# Patient Record
Sex: Female | Born: 1970 | Race: White | Hispanic: No | Marital: Married | Smoking: Current every day smoker
Health system: Southern US, Community
[De-identification: ages and names within clinical notes are randomized; demographics above are authoritative.]

## PROBLEM LIST (undated history)

## (undated) DIAGNOSIS — M549 Dorsalgia, unspecified: Secondary | ICD-10-CM

## (undated) DIAGNOSIS — I10 Essential (primary) hypertension: Secondary | ICD-10-CM

## (undated) DIAGNOSIS — R519 Headache, unspecified: Secondary | ICD-10-CM

## (undated) HISTORY — DX: Headache, unspecified: R51.9

## (undated) HISTORY — DX: Dorsalgia, unspecified: M54.9

## (undated) HISTORY — DX: Essential (primary) hypertension: I10

## (undated) HISTORY — PX: TONSILLECTOMY: SUR1361

## (undated) HISTORY — PX: ABDOMINAL HYSTERECTOMY: SHX81

## (undated) HISTORY — PX: CARPAL TUNNEL RELEASE: SHX101

## (undated) HISTORY — PX: CHOLECYSTECTOMY: SHX55

---

## 1997-04-23 HISTORY — PX: REDUCTION MAMMAPLASTY: SUR839

## 2004-08-12 ENCOUNTER — Encounter: Admission: RE | Admit: 2004-08-12 | Discharge: 2004-08-12 | Payer: Self-pay | Admitting: Family Medicine

## 2007-09-02 ENCOUNTER — Ambulatory Visit: Payer: Self-pay | Admitting: Family Medicine

## 2007-09-02 DIAGNOSIS — E785 Hyperlipidemia, unspecified: Secondary | ICD-10-CM

## 2007-09-02 DIAGNOSIS — F329 Major depressive disorder, single episode, unspecified: Secondary | ICD-10-CM

## 2007-09-02 DIAGNOSIS — E039 Hypothyroidism, unspecified: Secondary | ICD-10-CM

## 2007-09-02 DIAGNOSIS — F17200 Nicotine dependence, unspecified, uncomplicated: Secondary | ICD-10-CM | POA: Insufficient documentation

## 2007-09-02 LAB — CONVERTED CEMR LAB: TSH: 1.55 microintl units/mL (ref 0.350–5.50)

## 2007-09-03 ENCOUNTER — Encounter: Payer: Self-pay | Admitting: Family Medicine

## 2007-09-03 ENCOUNTER — Telehealth (INDEPENDENT_AMBULATORY_CARE_PROVIDER_SITE_OTHER): Payer: Self-pay | Admitting: *Deleted

## 2007-09-04 LAB — CONVERTED CEMR LAB: T3, Free: 3.4 pg/mL (ref 2.3–4.2)

## 2007-09-30 ENCOUNTER — Telehealth: Payer: Self-pay | Admitting: Family Medicine

## 2007-12-16 ENCOUNTER — Ambulatory Visit: Payer: Self-pay | Admitting: Family Medicine

## 2007-12-16 ENCOUNTER — Telehealth: Payer: Self-pay | Admitting: Family Medicine

## 2007-12-16 DIAGNOSIS — H6690 Otitis media, unspecified, unspecified ear: Secondary | ICD-10-CM | POA: Insufficient documentation

## 2008-01-08 ENCOUNTER — Ambulatory Visit: Payer: Self-pay | Admitting: Family Medicine

## 2008-01-08 DIAGNOSIS — J209 Acute bronchitis, unspecified: Secondary | ICD-10-CM

## 2008-05-08 ENCOUNTER — Ambulatory Visit: Payer: Self-pay | Admitting: Occupational Medicine

## 2008-05-11 ENCOUNTER — Telehealth (INDEPENDENT_AMBULATORY_CARE_PROVIDER_SITE_OTHER): Payer: Self-pay | Admitting: *Deleted

## 2009-05-30 ENCOUNTER — Ambulatory Visit: Payer: Self-pay | Admitting: Occupational Medicine

## 2009-05-30 DIAGNOSIS — M549 Dorsalgia, unspecified: Secondary | ICD-10-CM | POA: Insufficient documentation

## 2009-05-30 LAB — CONVERTED CEMR LAB
Eosinophils Absolute: 0 10*3/uL (ref 0.0–0.7)
Eosinophils Relative: 0 % (ref 0–5)
HCT: 42.1 % (ref 36.0–46.0)
Lymphocytes Relative: 30 % (ref 12–46)
Platelets: 202 10*3/uL (ref 150–400)
RDW: 12.3 % (ref 11.5–15.5)

## 2010-05-23 NOTE — Assessment & Plan Note (Signed)
Summary: COUGH/KH Room 1   Vital Signs:  Patient Profile:   41 Years Old Female CC:      Cold & URI symptoms, cough x 5 days fever 100 at night Height:     66.3 inches Weight:      176 pounds O2 Sat:      98 % Temp:     98.1 degrees F oral Pulse rate:   77 / minute Pulse rhythm:   regular Resp:     16 per minute BP sitting:   110 / 71  (left arm)  Vitals Entered By: Emilio Math (January 08, 2008 11:45 AM)                  Updated Prior Medication List: ZOLOFT 100 MG  TABS (SERTRALINE HCL) Take 1 tablet by mouth once a day CHANTIX CONTINUING MONTH PAK 1 MG  TABS (VARENICLINE TARTRATE) Take as directed MIRTAZAPINE 15 MG TABS (MIRTAZAPINE) Take 1 tablet by mouth once a day at bedtime  Current Allergies: ! AVELOX History of Present Illness Chief Complaint: Cold & URI symptoms, cough x 5 days fever 100 at night History of Present Illness: ONSET SUNDAY WITH SORE THROAT THAT RESOLVED. DEVELOPED RUNNY NOSE AND SINUS CONGESTION. COUGH IS CONSTANT OCC PRODUCTIVE. FEVER HAS BEEN LOW GRADE AT MOST. NO VOMITING OR DIARRHEA.   REVIEW OF SYSTEMS  Constitutional Symptoms       Complains of fever, chills, headaches, and body aches.   Ear/Nose/Throat/Mouth       Complains of nasal discharge, sinus problems, and sore throat.      Denies ear pain and ear discharge.  Respiratory       Complains of wheezing and frequent cough.      Denies shortness of breath.  Cardiovascular       Denies chest pain.  Gastrointestinal       Denies nausea/vomiting and diarrhea.       Physical Exam General appearance: well developed, well nourished, no acute distress Head: normocephalic, atraumatic Ears: normal, no lesions or deformities Nasal: NASAL CONGESTION WITH SINUS TENDERNESS Oral/Pharynx: tongue normal, posterior pharynx without erythema or exudate Neck: neck supple,  trachea midline, no masses Chest/Lungs: scattered wheezes throughout all lobes, CONSTANT COUGY Heart: regular rate and   rhythm, no murmur Skin: no obvious rashes or lesions     Patient Instructions: 1)  AVOID MILK AND CAFFINE PRODUCTS. MOTRIN AND TYLENOL AS NEEDED.   Assessment New Problems: BRONCHITIS, ACUTE (ICD-466.0)   Plan New Medications/Changes: PROVENTIL HFA 108 (90 BASE) MCG/ACT AERS (ALBUTEROL SULFATE) 2 PUFFS QID AS NEEDED  #1 x 0, 01/08/2008, Skyelyn Scruggs DO CHERATUSSIN AC 100-10 MG/5ML SYRP (GUAIFENESIN-CODEINE) 1-2 TSP Q 6 HRS as needed COUGH  #4 x 0, 01/08/2008, Vann Okerlund DO ZITHROMAX Z-PAK 250 MG TABS (AZITHROMYCIN) AS DIRECTED  #1 x 0, 01/08/2008, Marvis Moeller DO  New Orders: Est. Patient Level III [16109] Follow Up: Follow up in 2-3 days if no improvement     Prescriptions: PROVENTIL HFA 108 (90 BASE) MCG/ACT AERS (ALBUTEROL SULFATE) 2 PUFFS QID AS NEEDED  #1 x 0   Entered and Authorized by:   Marvis Moeller DO   Signed by:   Marvis Moeller DO on 01/08/2008   Method used:   Print then Give to Patient   RxID:   [id number] CHERATUSSIN AC 100-10 MG/5ML SYRP (GUAIFENESIN-CODEINE) 1-2 TSP Q 6 HRS as needed COUGH  #4 x 0   Entered and Authorized by:   Marvis Moeller DO  Signed by:   Marvis Moeller DO on 01/08/2008   Method used:   Print then Give to Patient   RxID:   [id number] ZITHROMAX Z-PAK 250 MG TABS (AZITHROMYCIN) AS DIRECTED  #1 x 0   Entered and Authorized by:   Marvis Moeller DO   Signed by:   Marvis Moeller DO on 01/08/2008   Method used:   Print then Give to Patient   RxID:   [id number].[email or web address]  ]  Past Medical History:    Reviewed history from 09/02/2007 and no changes required:       Current Problems:        HYPERLIPIDEMIA (ICD-272.4)       DEPRESSION, RECURRENT (ICD-311)       HYPOTHYROIDISM (ICD-244.9)  Past Surgical History:    Reviewed history from 09/02/2007 and no changes required:       Hysterectomy, endometriosis 2002       Carpel tunnel 2003, right & Left       Breast reduction 2001       Cholecystectomy2003        tonsillectomy 1990

## 2010-05-23 NOTE — Assessment & Plan Note (Signed)
Summary: UPPER BACK/TJ   Vital Signs:  Patient Profile:   40 Years Old Female CC:      Upper back pain x 1 & 1/2  weeks Height:     67.5 inches Weight:      180 pounds O2 Sat:      100 % O2 treatment:    Room Air Temp:     97.8 degrees F oral Pulse rate:   76 / minute Pulse rhythm:   regular Resp:     12 per minute BP sitting:   121 / 85  (right arm) Cuff size:   Upper back painregular  Pt. in pain?   yes    Location:   upper back    Intensity:   8    Type:       sharp  Vitals Entered By: Emilio Math (May 30, 2009 10:34 AM)                   Current Allergies (reviewed today): ! AVELOXHistory of Present Illness Chief Complaint: Upper back pain x 1 & 1/2  weeks History of Present Illness: Presents with complaints of low grade fever to 100,  body aches, sore throat, headache,  for the last 7-10 days.    She has not improved much at all.   No abdominal pain, nausea or vomiting.    Also complains of generalized malaise.  No reports of cough or shortness of breath.   She had a left lower lobe pneumonia 1 year ago, but has not been ill in the past year.     She has a second complaint of right scapular pain for the last 10 days or so.   Says the thinks she remembers falling around the time of onset.  No neck pain or low back pain.  No arm pain.   Ibuprofen does not help.   Current Meds * ADVIL 400 mg 4 x day * TYLENOL 2 tabs every 4 hours LAMICTAL 100 MG TABS (LAMOTRIGINE)   REVIEW OF SYSTEMS Constitutional Symptoms      Denies fever, chills, night sweats, weight loss, weight gain, and fatigue.  Eyes       Denies change in vision, eye pain, eye discharge, glasses, contact lenses, and eye surgery. Ear/Nose/Throat/Mouth       Denies hearing loss/aids, change in hearing, ear pain, ear discharge, dizziness, frequent runny nose, frequent nose bleeds, sinus problems, sore throat, hoarseness, and tooth pain or bleeding.  Respiratory       Denies dry cough, productive cough,  wheezing, shortness of breath, asthma, bronchitis, and emphysema/COPD.  Cardiovascular       Denies murmurs, chest pain, and tires easily with exhertion.    Gastrointestinal       Denies stomach pain, nausea/vomiting, diarrhea, constipation, blood in bowel movements, and indigestion. Genitourniary       Denies painful urination, kidney stones, and loss of urinary control. Neurological       Denies paralysis, seizures, and fainting/blackouts. Musculoskeletal       Complains of muscle pain, joint pain, and decreased range of motion.      Denies joint stiffness, redness, swelling, muscle weakness, and gout.  Skin       Denies bruising, unusual mles/lumps or sores, and hair/skin or nail changes.  Psych       Denies mood changes, temper/anger issues, anxiety/stress, speech problems, depression, and sleep problems.  Past History:  Past Medical History: Reviewed history from 01/08/2008 and no changes  required. Current Problems:  HYPERLIPIDEMIA (ICD-272.4) DEPRESSION, RECURRENT (ICD-311) HYPOTHYROIDISM (ICD-244.9)  Past Surgical History: Reviewed history from 05/08/2008 and no changes required. Hysterectomy, endometriosis 2002 Carpel tunnel 2003, right & Left Breast reduction 2001 Cholecystectomy2003 tonsillectomy 1990 elbow surgery 2009  Family History: Reviewed history from 09/02/2007 and no changes required. Grandparent with DM Parent, brother with Hi chol Brother with HTN  Social History: Reviewed history from 05/08/2008 and no changes required. 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  Married to [place] with 2 duaghter and one son.   Current Smoker - 1 pack per day for 15 years Alcohol use-no Drug use-no Regular exercise-yes Physical Exam General appearance: well developed, well nourished, no acute distress Head: normocephalic, atraumatic Eyes: conjunctivae and lids normal Pupils: equal, round, reactive to light Ears: normal, no lesions or deformities Nasal: mucosa  pink, nonedematous, no septal deviation, turbinates normal Oral/Pharynx: tongue normal, posterior pharynx without erythema or exudate Neck: supple,anterior lymphadenopathy present Chest/Lungs: no rales, wheezes, or rhonchi bilateral, breath sounds equal without effort Heart: regular rate and  rhythm, no murmur Tender in right scapular region.  Assessment New Problems: BACK PAIN, UPPER (ICD-724.5) UPPER RESPIRATORY INFECTION, ACUTE (ICD-465.9)   Plan New Orders: New Patient Level III [99203] T-CBC w/Diff [[id number]] Planning Comments:   I think she has a viral infection.  I will check a CBC today.   She will follow up with Dr. Cathey Endow or Linford Arnold if not better in 2 weeks.   I do not think she has a serious back injury either.  Recommend OTC ibuprofen and follow up  upstairs for PT if not better in 2 weeks.   The patient and/or caregiver has been counseled thoroughly with regard to medications prescribed including dosage, schedule, interactions, rationale for use, and possible side effects and they verbalize understanding.  Diagnoses and expected course of recovery discussed and will return if not improved as expected or if the condition worsens. Patient and/or caregiver verbalized understanding.

## 2010-05-25 ENCOUNTER — Ambulatory Visit (INDEPENDENT_AMBULATORY_CARE_PROVIDER_SITE_OTHER): Payer: PRIVATE HEALTH INSURANCE | Admitting: Emergency Medicine

## 2010-05-25 ENCOUNTER — Encounter: Payer: Self-pay | Admitting: Emergency Medicine

## 2010-05-25 DIAGNOSIS — H6980 Other specified disorders of Eustachian tube, unspecified ear: Secondary | ICD-10-CM | POA: Insufficient documentation

## 2010-05-25 DIAGNOSIS — J069 Acute upper respiratory infection, unspecified: Secondary | ICD-10-CM | POA: Insufficient documentation

## 2010-05-25 LAB — CONVERTED CEMR LAB: Rapid Strep: NEGATIVE

## 2010-05-26 ENCOUNTER — Encounter: Payer: Self-pay | Admitting: Emergency Medicine

## 2010-05-28 ENCOUNTER — Telehealth (INDEPENDENT_AMBULATORY_CARE_PROVIDER_SITE_OTHER): Payer: Self-pay | Admitting: *Deleted

## 2010-05-31 NOTE — Assessment & Plan Note (Signed)
Summary: SORE THROAT/EAR HURTS/NH room 4   Vital Signs:  Patient profile:   40 year old female Height:      68 inches Weight:      178 pounds BMI:     27.16 O2 Sat:      100 % on Room air Temp:     99.0 degrees F oral Pulse rate:   78 / minute Resp:     16 per minute BP sitting:   125 / 79  (left arm) Cuff size:   regular  Vitals Entered By: Clemens Catholic LPN (May 25, 2010 10:39 AM)  O2 Flow:  Room air CC: sore throat, ear ache , HA Is Patient Diabetic? No Comments pt c/o sore throat, HA , fatigue x 6days. she has taken OTC Advil. her daughter tested positive for strep last wk.    Chief Complaint:  sore throat, ear ache , and HA.  History of Present Illness: 40 Years Old Female complains of onset of cold symptoms for 5-6 days.  Rami has been using nothing OTC.  Her daughter had strep throat last week but is better now. + sore throat No cough No pleuritic pain No wheezing + nasal congestion + post-nasal drainage + sinus pain/pressure No chest congestion No itchy/red eyes + earache No hemoptysis No SOB No chills/sweats + fever No nausea No vomiting No abdominal pain No diarrhea No skin rashes No fatigue No myalgias No headache   Allergies (verified): 1)  ! Avelox  Past History:  Past Medical History: Reviewed history from 01/08/2008 and no changes required. Current Problems:  HYPERLIPIDEMIA (ICD-272.4) DEPRESSION, RECURRENT (ICD-311) HYPOTHYROIDISM (ICD-244.9)  Past Surgical History: Reviewed history from 05/08/2008 and no changes required. Hysterectomy, endometriosis 2002 Carpel tunnel 2003, right & Left Breast reduction 2001 Cholecystectomy2003 tonsillectomy 1990 elbow surgery 2009  Family History: Reviewed history from 09/02/2007 and no changes required. Grandparent with DM Parent, brother with Hi chol Brother with HTN  Social History: Reviewed history from 05/08/2008 and no changes required. 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  Married to [place] with 2 duaghter and one son.   Current Smoker - 1 pack per day for 15 years Alcohol use-no Drug use-no Regular exercise-yes  Physical Exam  General:  Well-developed,well-nourished,in no acute distress; alert,appropriate and cooperative throughout examination Ears:  Clear fluid and mild pressure behind both TM's, mild erythema L, canals normal Nose:  clear discharge Mouth:  clear PND, no erythema, no exudates, OP patent Neck:  no ant cerv LAD Lungs:  Normal respiratory effort, chest expands symmetrically. Lungs are clear to auscultation, no crackles or wheezes. Heart:  Normal rate and regular rhythm. S1 and S2 normal without gallop, murmur, click, rub or other extra sounds. Psych:  Cognition and judgment appear intact. Alert and cooperative with normal attention span and concentration. No apparent delusions, illusions, hallucinations  History of Present Illness Chief Complaint: sore throat, ear ache , HA    Impression & Recommendations:  Problem # 1:  URI (ICD-465.9) 1)  Take the prescribed antibiotic as instructed.  Hold for a few days since this is currently likely viral.  Rapid strep negative. Throat culture is pending. 2)  Use nasal saline solution (over the counter) at least 3 times a day. 3)  Use over the counter decongestants like Zyrtec-D every 12 hours as needed to help with congestion. 4)  Can take tylenol every 6 hours or motrin every 8 hours for pain or fever. 5)  Follow up with  your primary doctor  if no improvement in 5-7 days, sooner if increasing pain, fever, or new symptoms.    Orders: Rapid Strep (91478) T-Culture, Throat ([id number])  Complete Medication List: 1)  Zoloft 50 Mg Tabs (Sertraline hcl) 2)  Amoxicillin 875 Mg Tabs (Amoxicillin) .Marland Kitchen.. 1 by mouth two times a day for 7 days Prescriptions: AMOXICILLIN 875 MG TABS (AMOXICILLIN) 1 by mouth two times a day for 7 days  #14 x 0   Entered and Authorized by:   Hoyt Koch MD   Signed by:   Hoyt Koch MD on 05/25/2010   Method used:   Print then Give to Patient   RxID:   [id number]    Orders Added: 1)  Est. Patient Level IV [44010] 2)  Rapid Strep [27253] 3)  T-Culture, Throat [[id number]]    Laboratory Results  Date/Time Received: May 25, 2010 10:51 AM  Date/Time Reported: May 25, 2010 10:51 AM   Other Tests  Rapid Strep: negative  Kit Test Internal QC: Negative   (Normal Range: Negative)

## 2010-06-08 NOTE — Progress Notes (Signed)
  Phone Note Outgoing Call Call back at [facility] Phone [phone] [facility]     Call placed by: Emilio Math,  May 28, 2010 11:53 AM Call placed to: Patient Summary of Call: Home number has been disconected.

## 2010-09-27 ENCOUNTER — Inpatient Hospital Stay (INDEPENDENT_AMBULATORY_CARE_PROVIDER_SITE_OTHER)
Admission: RE | Admit: 2010-09-27 | Discharge: 2010-09-27 | Disposition: A | Discharge status: Home / Self Care | Payer: PRIVATE HEALTH INSURANCE | Source: Ambulatory Visit | Attending: Emergency Medicine | Admitting: Emergency Medicine

## 2010-09-27 ENCOUNTER — Encounter: Payer: Self-pay | Admitting: Emergency Medicine

## 2010-09-27 DIAGNOSIS — M79609 Pain in unspecified limb: Secondary | ICD-10-CM | POA: Insufficient documentation

## 2011-03-26 NOTE — Progress Notes (Signed)
Summary: PAIN IN RIGHT HAND   Vital Signs:  Patient Profile:   40 Years Old Female CC:      right hand pain x 1 month Height:     68 inches Weight:      184 pounds O2 Sat:      100 % O2 treatment:    Room Air Temp:     98.3 degrees F oral Pulse rate:   88 / minute Resp:     16 per minute BP sitting:   134 / 87  (left arm) Cuff size:   regular  Pt. in pain?   yes    Location:   right hand    Intensity:   5    Type:       ache  Vitals Entered By: Lajean Saver RN (September 27, 2010 3:38 PM)                   Updated Prior Medication List: No Medications Current Allergies (reviewed today): ! AVELOXHistory of Present Illness History from: patient Chief Complaint: right hand pain x 1 month History of Present Illness: R hand pain for a month. Localized in her R thumb and index finger.  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  The pain is intermittant but daily and she feels that those fingers lock up.  No weakness or numbness noticed.  Sometimes is worse at night.  Not using any meds or modalities for the pain. No elbow/write/shoulder pain.  She is R handed.  REVIEW OF SYSTEMS Constitutional Symptoms      Denies fever, chills, night sweats, weight loss, weight gain, and fatigue.  Eyes       Denies change in vision, eye pain, eye discharge, glasses, contact lenses, and eye surgery. Ear/Nose/Throat/Mouth       Denies hearing loss/aids, change in hearing, ear pain, ear discharge, dizziness, frequent runny nose, frequent nose bleeds, sinus problems, sore throat, hoarseness, and tooth pain or bleeding.  Respiratory       Denies dry cough, productive cough, wheezing, shortness of breath, asthma, bronchitis, and emphysema/COPD.  Cardiovascular       Denies murmurs, chest pain, and tires easily with exhertion.    Gastrointestinal       Denies stomach pain, nausea/vomiting, diarrhea, constipation, blood in bowel  movements, and indigestion. Genitourniary       Denies painful urination, kidney stones, and loss of urinary control. Neurological       Denies paralysis, seizures, and fainting/blackouts. Musculoskeletal       Complains of muscle pain, joint pain, joint stiffness, and swelling.      Denies decreased range of motion, redness, muscle weakness, and gout.      Comments: right hand Skin       Denies bruising, unusual mles/lumps or sores, and hair/skin or nail changes.  Psych       Denies mood changes, temper/anger issues, anxiety/stress, speech problems, depression, and sleep problems. Other Comments: patient c/o right hand pain x 1 month. thumb and index finger "locking up". Pain is increasing and more constant. Used Advil for pain   Past History:  Past Medical History: Reviewed history from 01/08/2008 and no changes required. Current Problems:  HYPERLIPIDEMIA (ICD-272.4) DEPRESSION, RECURRENT (ICD-311) HYPOTHYROIDISM (ICD-244.9)  Past Surgical History: Reviewed history from 05/08/2008 and no changes required. Hysterectomy, endometriosis 2002 Carpel tunnel 2003, right & Left Breast reduction 2001 Cholecystectomy2003 tonsillectomy 1990 elbow surgery  2009  Family History: Reviewed history from 09/02/2007 and no changes required. Grandparent with DM Parent, brother with Hi chol Brother with HTN  Social History: Reviewed history from 05/08/2008 and no changes required. 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  Married to [place] with 2 duaghter and one son.   Current Smoker - 1 pack per day for 15 years Alcohol use-no Drug use-no Regular exercise-yes Physical Exam General appearance: well developed, well nourished, no acute distress MSE: oriented to time, place, and person Thumb and index finger on R hand: FROM active and passive with flexion & extension of DIP, PIP, and MCP.  No nodules felt.  No collateral ligement laxity.  Normal sensation and normal cap refill.  No  deformity or atrophy, no swellilng, no bruising. Assessment New Problems: HAND PAIN (ICD-729.5)   Plan New Orders: Est. Patient Level III [63875] Thumb Spica [L3070] Planning Comments:   Due to her history of median nerve problems, she may have residual scarring from her surgery causing her pain and dysfunction.  Encourage OTC antiinflammatories and ice the next 2 weeks.  Will place her in a thumb spica splint at night.  If not improving in 2 weeks, would refer to Dr. Melvyn Novas for an evaluation +/- OT eval.  However instead she may return to her same neurologist that cared for her in the past since he has her records and may be able to do follow up EMG studies.  No Xrays done today because no trauma.   The patient and/or caregiver has been counseled thoroughly with regard to medications prescribed including dosage, schedule, interactions, rationale for use, and possible side effects and they verbalize understanding.  Diagnoses and expected course of recovery discussed and will return if not improved as expected or if the condition worsens. Patient and/or caregiver verbalized understanding.   Orders Added: 1)  Est. Patient Level III [64332] 2)  Thumb Spica [L3070]

## 2013-01-27 ENCOUNTER — Encounter: Payer: Self-pay | Admitting: *Deleted

## 2013-01-27 ENCOUNTER — Emergency Department (INDEPENDENT_AMBULATORY_CARE_PROVIDER_SITE_OTHER)
Admission: EM | Admit: 2013-01-27 | Discharge: 2013-01-27 | Disposition: A | Discharge status: Home / Self Care | Payer: Managed Care, Other (non HMO) | Source: Home / Self Care | Attending: Family Medicine | Admitting: Family Medicine

## 2013-01-27 ENCOUNTER — Emergency Department (INDEPENDENT_AMBULATORY_CARE_PROVIDER_SITE_OTHER): Payer: Managed Care, Other (non HMO)

## 2013-01-27 DIAGNOSIS — J209 Acute bronchitis, unspecified: Secondary | ICD-10-CM

## 2013-01-27 DIAGNOSIS — R509 Fever, unspecified: Secondary | ICD-10-CM

## 2013-01-27 DIAGNOSIS — R05 Cough: Secondary | ICD-10-CM

## 2013-01-27 LAB — POCT CBC W AUTO DIFF (K'VILLE URGENT CARE)

## 2013-01-27 MED ORDER — AMOXICILLIN ER 775 MG PO TB24: 775.0000 mg | ORAL_TABLET | Freq: Every day | ORAL | Status: DC

## 2013-01-27 MED ORDER — PREDNISONE 20 MG PO TABS: 20.0000 mg | ORAL_TABLET | Freq: Two times a day (BID) | ORAL | Status: DC

## 2013-01-27 MED ORDER — ALBUTEROL SULFATE HFA 108 (90 BASE) MCG/ACT IN AERS: 2.0000 | INHALATION_SPRAY | RESPIRATORY_TRACT | Status: DC | PRN

## 2013-01-27 MED ORDER — BENZONATATE 100 MG PO CAPS: ORAL_CAPSULE | ORAL | Status: DC

## 2013-01-27 NOTE — ED Provider Notes (Signed)
CSN: [id number]     Arrival date & time 01/27/13  0903 History   First MD Initiated Contact with Patient 01/27/13 0940     Chief Complaint  Patient presents with  . Cough  . Otalgia     HPI Comments: Patient developed a sore throat one week ago that was diagnosed as strep pharyngitis, and she is presently taking a 10 day course of amoxicillin.  Her sore throat is now much better. Six days ago she developed sinus congestion and a non-productive cough that has persisted and is worse at night.  She has had sweats for the past two days. She has had pneumonia in the past. She continues to smoke.  Patient is a 42 y.o. female presenting with ear pain. The history is provided by the patient.  Otalgia   History reviewed. No pertinent past medical history. Past Surgical History  Procedure Laterality Date  . Cholecystectomy    . Abdominal hysterectomy    . Tonsillectomy    . Carpal tunnel release     History reviewed. No pertinent family history. History  Substance Use Topics  . Smoking status: Current Every Day Smoker    Types: Cigarettes  . Smokeless tobacco: Never Used     Comment: vapor cigs  . Alcohol Use: No   OB History   Grav Para Term Preterm Abortions TAB SAB Ect Mult Living                 Review of Systems  HENT: Positive for ear pain.    + sore throat + cough No pleuritic pain No wheezing + nasal congestion + post-nasal drainage No sinus pain/pressure No itchy/red eyes ? earache No hemoptysis + SOB + low grade fever, + chills No nausea No vomiting No abdominal pain No diarrhea No urinary symptoms No skin rashes + fatigue + myalgias + headache Used OTC meds without relief  Allergies  Moxifloxacin  Home Medications   Current Outpatient Rx  Name  Route  Sig  Dispense  Refill  . amoxicillin (AMOXIL) 500 MG tablet   Oral   Take 500 mg by mouth 2 (two) times daily.         . DULoxetine (CYMBALTA) 60 MG capsule   Oral   Take 60 mg by mouth  daily.         Marland Kitchen albuterol (PROVENTIL HFA;VENTOLIN HFA) 108 (90 BASE) MCG/ACT inhaler   Inhalation   Inhale 2 puffs into the lungs every 4 (four) hours as needed for wheezing.   1 Inhaler   0   . amoxicillin (MOXATAG) 775 MG 24 hr tablet   Oral   Take 1 tablet (775 mg total) by mouth daily.   4 tablet   0   . benzonatate (TESSALON) 100 MG capsule      Take one cap at bedtime as necessary for cough   12 capsule   0   . predniSONE (DELTASONE) 20 MG tablet   Oral   Take 1 tablet (20 mg total) by mouth 2 (two) times daily.   10 tablet   0    BP 136/87  Pulse 90  Temp(Src) 99.1 F (37.3 C) (Oral)  Resp 18  Wt 209 lb (94.802 kg)  BMI 31.79 kg/m2  SpO2 100% Physical Exam Nursing notes and Vital Signs reviewed. Appearance:  Patient appears stated age, and in no acute distress.  Patient is obese (BMI 31.8) Eyes:  Pupils are equal, round, and reactive to light and accomodation.  Extraocular movement is intact.  Conjunctivae are not inflamed  Ears:  Canals normal.  Tympanic membranes normal.  Nose:  Mildly congested turbinates.  No sinus tenderness.  Pharynx:  Normal Neck:  Supple.  Slightly tender shotty anterior/posterior nodes are palpated bilaterally  Lungs:  Clear to auscultation.  Breath sounds are equal.  Heart:  Regular rate and rhythm without murmurs, rubs, or gallops.  Abdomen:  Nontender without masses or hepatosplenomegaly.  Bowel sounds are present.  No CVA or flank tenderness.  Extremities:  No edema.  No calf tenderness Skin:  No rash present.   ED Course  Procedures (including critical care time) Labs Review Labs Reviewed  POCT CBC W AUTO DIFF ([facility])  WBC 9.0; LY 27.7; MO 4.9; GR 67.4; Hgb 13.5; Platelets 258    Imaging Review Dg Chest 2 View  01/27/2013   CLINICAL DATA:  Cough for 1 week. Persistent fever.  EXAM: CHEST  2 VIEW  COMPARISON:  05/08/2008  FINDINGS: The heart size and mediastinal contours are within normal limits. Both lungs  are clear. The visualized skeletal structures are unremarkable.  IMPRESSION: No active cardiopulmonary disease.   Electronically Signed   By: Amie Portland M.D.   On: 01/27/2013 10:26    MDM   1. Acute bronchitis.  Suspect strep pharyngitis (resolving) with superimposed viral URI    Will continue amoxicillin for 4 more days.  Prednisone burst.  Prescription written for Benzonatate (Tessalon) to take at bedtime for night-time cough.  Add albuterol inhaler. Take Mucinex D (guaifenesin with decongestant) twice daily for congestion.  Increase fluid intake, rest. May use Afrin nasal spray (or generic oxymetazoline) twice daily for about 5 days.  Also recommend using saline nasal spray several times daily and saline nasal irrigation (AYR is a common brand) Stop all antihistamines for now, and other non-prescription cough/cold preparations. Follow-up with family doctor if not improving about one week.     Lattie Haw, MD 01/27/13 [id number]

## 2013-01-27 NOTE — ED Notes (Signed)
Stacey Alvarado was treated for strep with amoxicillin 8 days ago. About 6 days ago she developed a productive cough and left ear pain. Sore throat resolved.

## 2013-06-26 ENCOUNTER — Ambulatory Visit (INDEPENDENT_AMBULATORY_CARE_PROVIDER_SITE_OTHER): Payer: Managed Care, Other (non HMO) | Admitting: Family Medicine

## 2013-06-26 ENCOUNTER — Encounter: Payer: Self-pay | Admitting: Family Medicine

## 2013-06-26 VITALS — BP 142/81 | HR 92 | Resp 18 | Wt 211.0 lb

## 2013-06-26 DIAGNOSIS — M5412 Radiculopathy, cervical region: Secondary | ICD-10-CM

## 2013-06-26 DIAGNOSIS — F3289 Other specified depressive episodes: Secondary | ICD-10-CM

## 2013-06-26 DIAGNOSIS — M2550 Pain in unspecified joint: Secondary | ICD-10-CM

## 2013-06-26 DIAGNOSIS — F988 Other specified behavioral and emotional disorders with onset usually occurring in childhood and adolescence: Secondary | ICD-10-CM

## 2013-06-26 DIAGNOSIS — F329 Major depressive disorder, single episode, unspecified: Secondary | ICD-10-CM

## 2013-06-26 LAB — RHEUMATOID FACTOR

## 2013-06-26 MED ORDER — AMPHETAMINE-DEXTROAMPHET ER 10 MG PO CP24: 10.0000 mg | ORAL_CAPSULE | Freq: Every day | ORAL | Status: DC

## 2013-06-26 MED ORDER — PREDNISONE 20 MG PO TABS
ORAL_TABLET | ORAL | Status: AC
Start: 2013-06-26 — End: 2013-07-01

## 2013-06-26 MED ORDER — TRAMADOL HCL 50 MG PO TABS: 50.0000 mg | ORAL_TABLET | Freq: Three times a day (TID) | ORAL | Status: DC | PRN

## 2013-06-26 NOTE — Progress Notes (Signed)
CC: Stacey Alvarado is a 43 y.o. female is here for Back Pain   Subjective: HPI:  Patient presents to reestablish after being gone for greater than 3 years our clinic  Complains of lower neck pain that radiates into the right arm that has been present for the past 2 weeks present on a daily basis. Nothing particularly makes worse. Slightly improved with ibuprofen 800 mg or tramadol 50 mg. Pain is hard to describe described only as pain 7/10 in severity pain radiating to the arm is described as sharp radiates through the entirety of the arm. She denies any motor or sensory disturbances otherwise no upper extremity. Localizes pain centrally just above the shoulder blades in the back. Denies cough, shortness of breath, wheezing, weakness, numbness, nor chest pain. Review of systems is positive for pain in multiple joints symmetric including knees elbows and hands which has been present for months to years  Complains of difficulty concentrating and has been present since her college years was originally prescribed stimulant ADHD medication which helped symptoms in college however stopped after graduating. Symptoms have returned to a degree that it is now interfering with handling multiple tasks at home and especially at work, a recent job review had a lot of negativity with respect to her not appropriately completing tasks at work. 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 Symptoms are present on a daily basis moderate severity present all hours of the day nothing particularly makes better or worse currently.  Review Of Systems Outlined In HPI  No past medical history on file.  Past Surgical History  Procedure Laterality Date  . Cholecystectomy    . Abdominal hysterectomy    . Tonsillectomy    . Carpal tunnel release     No family history on file.  History   Social History  . Marital Status: Married    Spouse Name: N/A    Number of Children: N/A  .  Years of Education: N/A   Occupational History  . Not on file.   Social History Main Topics  . Smoking status: Current Every Day Smoker    Types: Cigarettes  . Smokeless tobacco: Never Used     Comment: vapor cigs  . Alcohol Use: No  . Drug Use: Not on file  . Sexual Activity: Not on file   Other Topics Concern  . Not on file   Social History Narrative  . No narrative on file     Objective: BP 142/81  Pulse 92  Resp 18  Wt 211 lb (95.709 kg)  SpO2 99%  General: Alert and Oriented, No Acute Distress HEENT: Pupils equal, round, reactive to light. Conjunctivae clear.  Moist membranes pharynx unremarkable Lungs: Clear comfortable work of breathing Cardiac: Regular rate and rhythm Right shoulder exam reveals full range of motion and strength in all planes of motion and with individual rotator cuff testing. No overlying redness warmth or swelling.  Neer's test negative.  Hawkins test negative. Empty can negative. Crossarm test negative. O'Brien's test negative. Apprehension test negative. Speed's test negative. Back: Spurling's test positive reproducing radiation of pain into arm. No midline cervical spine tenderness Extremities: No peripheral edema.  Strong peripheral pulses.  C5 DTR 2/4 bilaterally.  Full range of motion strength in both upper extremities Mental Status: No depression, anxiety, nor agitation. Skin: Warm and dry.  Assessment & Plan: Talya was seen today for back pain.  Diagnoses and associated orders for this  visit:  Multiple joint pain - Cyclic citrul peptide antibody, IgG - Antinuclear Antib (ANA) - Rheumatoid Factor - traMADol (ULTRAM) 50 MG tablet; Take 1 tablet (50 mg total) by mouth every 8 (eight) hours as needed.  Cervical radiculitis - predniSONE (DELTASONE) 20 MG tablet; Three tabs at once daily for five days.  ADD (attention deficit disorder) - amphetamine-dextroamphetamine (ADDERALL XR) 10 MG 24 hr capsule; Take 1 capsule (10 mg total) by  mouth daily.  DEPRESSION, RECURRENT    MRI from 2012 with [facility] reviewed showing degenerative disc disease. History and exam is suspicious for cervical radiculitis therefore start prednisone and as needed tramadol. Would start gabapentin in the future if symptoms are persistent. Multiple joint pain: Labs above to look for autoimmune disease etiology ADD: Uncontrolled chronic condition, Start Adderall in followup in one month   Return in about 4 weeks (around 07/24/2013) for ADD FU.

## 2013-06-29 LAB — CYCLIC CITRUL PEPTIDE ANTIBODY, IGG

## 2013-06-29 LAB — ANA: ANA: NEGATIVE

## 2013-06-30 ENCOUNTER — Telehealth: Payer: Self-pay | Admitting: *Deleted

## 2013-06-30 NOTE — Telephone Encounter (Signed)
Pt.notified

## 2013-06-30 NOTE — Telephone Encounter (Signed)
Pt states the adderall doesn't seem to be working. I looked back at the progeress note and I confirmed with pt that she has only been taking the adderall at the current dose for 4 days. i told her she prob should give it more time but I would forward this note to Dr. Ivan AnchorsHommel in case adjustments could be made now

## 2013-06-30 NOTE — Telephone Encounter (Signed)
I'd encourage her to give it a few weeks before giving up on her current dose.  If not better after 2-3 weeks follow up to discuss other options.

## 2013-07-07 ENCOUNTER — Encounter: Payer: Self-pay | Admitting: Family Medicine

## 2013-07-07 ENCOUNTER — Ambulatory Visit (INDEPENDENT_AMBULATORY_CARE_PROVIDER_SITE_OTHER): Payer: Managed Care, Other (non HMO) | Admitting: Family Medicine

## 2013-07-07 VITALS — BP 142/91 | HR 109 | Wt 216.0 lb

## 2013-07-07 DIAGNOSIS — M5412 Radiculopathy, cervical region: Secondary | ICD-10-CM

## 2013-07-07 DIAGNOSIS — M2550 Pain in unspecified joint: Secondary | ICD-10-CM | POA: Insufficient documentation

## 2013-07-07 DIAGNOSIS — F988 Other specified behavioral and emotional disorders with onset usually occurring in childhood and adolescence: Secondary | ICD-10-CM

## 2013-07-07 MED ORDER — AMPHETAMINE-DEXTROAMPHETAMINE 10 MG PO TABS: 10.0000 mg | ORAL_TABLET | Freq: Two times a day (BID) | ORAL | Status: DC

## 2013-07-07 NOTE — Progress Notes (Signed)
CC: Stacey Alvarado is a 43 y.o. female is here for adhd f/u   Subjective: HPI:  Followup ADD: Patient states that taking 10 mg of Adderall xr does not seem to be helping with task completion, concentration, and difficulty dealing with distractions at her work. Symptoms still remain of moderate severity on a daily basis. She denies any side effects from the medication specifically denies paranoia, anxiety, rapid heartbeat, chest pain, appetite suppression, nor difficulty sleeping.  Followup cervical radiculopathy: Patient states that after taking prednisone neck pain and right arm pain have completely resolved  She would like to know what the results of her recent rheumatologic labs mean with respect to her chronic joint pain is localized in wrists, elbows, knees, hips. This pain is worse after inactivity improves greatly within minutes after becoming active. She denies new swelling redness or warmth of any joints in her body. Pain does improve with occasional ibuprofen   Review Of Systems Outlined In HPI  No past medical history on file.  Past Surgical History  Procedure Laterality Date  . Cholecystectomy    . Abdominal hysterectomy    . Tonsillectomy    . Carpal tunnel release     No family history on file.  History   Social History  . Marital Status: Married    Spouse Name: N/A    Number of Children: N/A  . Years of Education: N/A   Occupational History  . Not on file.   Social History Main Topics  . Smoking status: Current Every Day Smoker    Types: Cigarettes  . Smokeless tobacco: Never Used     Comment: vapor cigs  . Alcohol Use: No  . Drug Use: Not on file  . Sexual Activity: Not on file   Other Topics Concern  . Not on file   Social History Narrative  . No narrative on file     Objective: BP 142/91  Pulse 109  Wt 216 lb (97.977 kg)  General: Alert and Oriented, No Acute Distress HEENT: Pupils equal, round, reactive to light. Conjunctivae clear.  Moist  membranes pharynx unremarkable Lungs: Clear to auscultation bilaterally, no wheezing/ronchi/rales.  Comfortable work of breathing. Good air movement. Cardiac: Regular rate and rhythm. Extremities: No peripheral edema.  Strong peripheral pulses. Full range of motion and strength in all 4 extremities without swelling redness or warmth of any joints Mental Status: No depression, anxiety, nor agitation. Skin: Warm and dry.  Assessment & Plan: Stacey Alvarado was seen today for adhd f/u.  Diagnoses and associated orders for this visit:  Cervical radiculitis  Multiple joint pain  ADD (attention deficit disorder)  Other Orders - amphetamine-dextroamphetamine (ADDERALL) 10 MG tablet; Take 1 tablet (10 mg total) by mouth 2 (two) times daily with a meal.    Cervical radicular is: Resolved Multiple joint pain: Discussed that rheumatologic labs would argue against any autoimmune disease contributing to her joint pain. Her presentation sounds consistent with osteoarthritis which will improve with physical exercise, conditioning, and over-the-counter anti-inflammatories ADD: Chronic uncontrolled condition increase total daily Adderall dose to 10 mg twice a day   Return in about 4 weeks (around 08/04/2013).

## 2013-07-20 ENCOUNTER — Ambulatory Visit (INDEPENDENT_AMBULATORY_CARE_PROVIDER_SITE_OTHER): Payer: Managed Care, Other (non HMO) | Admitting: Family Medicine

## 2013-07-20 ENCOUNTER — Encounter: Payer: Self-pay | Admitting: Family Medicine

## 2013-07-20 VITALS — BP 142/91 | HR 100 | Temp 98.1°F | Wt 213.0 lb

## 2013-07-20 DIAGNOSIS — F988 Other specified behavioral and emotional disorders with onset usually occurring in childhood and adolescence: Secondary | ICD-10-CM

## 2013-07-20 DIAGNOSIS — A499 Bacterial infection, unspecified: Secondary | ICD-10-CM

## 2013-07-20 DIAGNOSIS — B9689 Other specified bacterial agents as the cause of diseases classified elsewhere: Secondary | ICD-10-CM

## 2013-07-20 DIAGNOSIS — J329 Chronic sinusitis, unspecified: Secondary | ICD-10-CM

## 2013-07-20 MED ORDER — AMOXICILLIN-POT CLAVULANATE 500-125 MG PO TABS: ORAL_TABLET | ORAL | Status: AC

## 2013-07-20 MED ORDER — AMPHETAMINE-DEXTROAMPHETAMINE 10 MG PO TABS: 15.0000 mg | ORAL_TABLET | Freq: Two times a day (BID) | ORAL | Status: DC

## 2013-07-20 NOTE — Progress Notes (Signed)
CC: Stacey Alvarado is a 43 y.o. female is here for Sinusitis   Subjective: HPI:  Complains of nasal congestion and facial pressure localized to the forehead and beneath both eyes that has been present for the past week worsening on a daily basis originally accompanied by sore throat however this is resolved. Now complaining by a nonproductive cough present all hours of the day overall symptoms are moderate in severity. She's using Afrin but nothing else which helps only with nasal congestion. Denies fevers, chills, shortness of breath, wheezing, nor motor or sensory disturbances  She mentions that since increasing Adderall she's only noticed a mild improvement with concentration and easy distractibility at work. Symptoms have mildly improved without any known side effects such as anxiety, paranoia, irregular heart beat nor sleep disturbance. She believes there is still room for improvement   Review Of Systems Outlined In HPI  No past medical history on file.  Past Surgical History  Procedure Laterality Date  . Cholecystectomy    . Abdominal hysterectomy    . Tonsillectomy    . Carpal tunnel release     No family history on file.  History   Social History  . Marital Status: Married    Spouse Name: N/A    Number of Children: N/A  . Years of Education: N/A   Occupational History  . Not on file.   Social History Main Topics  . Smoking status: Current Every Day Smoker    Types: Cigarettes  . Smokeless tobacco: Never Used     Comment: vapor cigs  . Alcohol Use: No  . Drug Use: Not on file  . Sexual Activity: Not on file   Other Topics Concern  . Not on file   Social History Narrative  . No narrative on file     Objective: BP 142/91  Pulse 100  Temp(Src) 98.1 F (36.7 C) (Oral)  Wt 213 lb (96.616 kg)  General: Alert and Oriented, No Acute Distress HEENT: Pupils equal, round, reactive to light. Conjunctivae clear.  External ears unremarkable, canals clear with intact  TMs with appropriate landmarks.  Middle ear appears open without effusion. Bony erythematous inferior turbinates with moderate mucoid discharge.  Moist mucous membranes, pharynx without inflammation nor lesions.  Neck supple without palpable lymphadenopathy nor abnormal masses. Lungs: Clear to auscultation bilaterally, no wheezing/ronchi/rales.  Comfortable work of breathing. Good air movement. Cardiac: Regular rate and rhythm. Normal S1/S2.  No murmurs, rubs, nor gallops.   Mental Status: No depression, anxiety, nor agitation. Skin: Warm and dry.  Assessment & Plan: [name] was seen today for sinusitis.  Diagnoses and associated orders for this visit:  Bacterial sinusitis - amoxicillin-clavulanate (AUGMENTIN) 500-125 MG per tablet; Take one by mouth every 8 hours for ten total days.  ADD (attention deficit disorder)  Other Orders - amphetamine-dextroamphetamine (ADDERALL) 10 MG tablet; Take 1.5 tablets (15 mg total) by mouth 2 (two) times daily with a meal.    ADD: Uncontrolled chronic condition increase Adderall to 15 mg twice a day as needed Actual sinusitis: Start Augmentin consider nasal saline washes and Alka-Seltzer cold and sinus as needed for symptom control  Return if symptoms worsen or fail to improve.

## 2013-07-27 ENCOUNTER — Telehealth: Payer: Self-pay | Admitting: Family Medicine

## 2013-07-27 DIAGNOSIS — F329 Major depressive disorder, single episode, unspecified: Secondary | ICD-10-CM

## 2013-07-27 DIAGNOSIS — F32A Depression, unspecified: Secondary | ICD-10-CM

## 2013-07-27 MED ORDER — AMPHETAMINE-DEXTROAMPHETAMINE 15 MG PO TABS: 15.0000 mg | ORAL_TABLET | Freq: Two times a day (BID) | ORAL | Status: DC

## 2013-07-27 NOTE — Telephone Encounter (Signed)
Pt notified rx up front

## 2013-07-27 NOTE — Telephone Encounter (Signed)
Ok, that makes much more sense. They make a 15mg  tablet that can be taken twice a day, I'll write an Rx for 60 of these. (in your inbox)

## 2013-07-27 NOTE — Telephone Encounter (Signed)
Stacey Alvarado, Can you clarify what's being requested, cymbalta does not come in a 15mg  formulation.  Is she requesting a cymbalta adjustment or adderall adjustment?

## 2013-07-27 NOTE — Telephone Encounter (Signed)
Insurance will only pay for 60 tablets of adderall. She wants to know if want her to do 20 mg BID

## 2013-07-27 NOTE — Telephone Encounter (Signed)
Patient's insurance will not cover a 90 day supply of cymbalta.  She has done fine on the 15 mg and wants to know if you will up it to 20 mg and call in a 60 day supply.  She needs the script today because she is running low.  thanks

## 2013-08-04 ENCOUNTER — Encounter: Payer: Self-pay | Admitting: Family Medicine

## 2013-08-12 ENCOUNTER — Telehealth: Payer: Self-pay | Admitting: *Deleted

## 2013-08-12 NOTE — Telephone Encounter (Signed)
This is an option however I'm only willing to make adjustments a month at a time since it's a controlled substance and I don't feel comfortable with overlapping prescriptions.  When her next refill is due around May 5th if she still feels a need to escalate the next Rx would be for 20mg  twice a day.

## 2013-08-12 NOTE — Telephone Encounter (Signed)
Left a detailed message on patient mvm with instructions as noted below. Rhonda Cunningham,CMA

## 2013-08-12 NOTE — Telephone Encounter (Signed)
Pt called and states the adderall dose last rx'ed is not helping and wants to know if the dose can be increased.Pt has been taking 15 mg bid and she states she has not noticed a difference

## 2013-08-21 ENCOUNTER — Encounter: Payer: Self-pay | Admitting: Family Medicine

## 2013-08-21 ENCOUNTER — Ambulatory Visit (INDEPENDENT_AMBULATORY_CARE_PROVIDER_SITE_OTHER): Payer: Managed Care, Other (non HMO) | Admitting: Family Medicine

## 2013-08-21 VITALS — BP 140/87 | HR 107 | Wt 207.0 lb

## 2013-08-21 DIAGNOSIS — M1711 Unilateral primary osteoarthritis, right knee: Secondary | ICD-10-CM

## 2013-08-21 DIAGNOSIS — F988 Other specified behavioral and emotional disorders with onset usually occurring in childhood and adolescence: Secondary | ICD-10-CM

## 2013-08-21 DIAGNOSIS — IMO0002 Reserved for concepts with insufficient information to code with codable children: Secondary | ICD-10-CM

## 2013-08-21 DIAGNOSIS — M1710 Unilateral primary osteoarthritis, unspecified knee: Secondary | ICD-10-CM

## 2013-08-21 MED ORDER — AMPHETAMINE-DEXTROAMPHETAMINE 20 MG PO TABS: 20.0000 mg | ORAL_TABLET | Freq: Two times a day (BID) | ORAL | Status: DC

## 2013-08-21 MED ORDER — MELOXICAM 15 MG PO TABS: 15.0000 mg | ORAL_TABLET | Freq: Every day | ORAL | Status: DC

## 2013-08-21 NOTE — Progress Notes (Signed)
CC: Stacey Alvarado is a 43 y.o. female is here for recheck right knee   Subjective: HPI:  Followup ADD: Patient reports slight improvement in dealing with distractions and focusing at work since increasing Adderall last visit. She reports that she's 50% improved but still reports room for improvement. Denies known side effects. Denies anxiety, sleep disturbance, appetite suppression, nor mental disturbance.  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  pain is worse with going up or down stairs, worse with standing for long periods of time. He has been accompanied with mild swelling but denies redness, warmth, nor bruising. Pain is localized "behind the kneecap" and nonradiating. She was evaluated by the orthopedic group on Wednesday with unremarkable x-rays and instructions to follow up in one week if not improved. Mild improvement with ibuprofen other interventions. Denies catching locking or giving way.  Denies fevers, chills, swelling elsewhere in the body   Review Of Systems Outlined In HPI  No past medical history on file.  Past Surgical History  Procedure Laterality Date  . Cholecystectomy    . Abdominal hysterectomy    . Tonsillectomy    . Carpal tunnel release     No family history on file.  History   Social History  . Marital Status: Married    Spouse Name: N/A    Number of Children: N/A  . Years of Education: N/A   Occupational History  . Not on file.   Social History Main Topics  . Smoking status: Current Every Day Smoker    Types: Cigarettes  . Smokeless tobacco: Never Used     Comment: vapor cigs  . Alcohol Use: No  . Drug Use: Not on file  . Sexual Activity: Not on file   Other Topics Concern  . Not on file   Social History Narrative  . No narrative on file     Objective: BP 140/87  Pulse 107   Wt 207 lb (93.895 kg)  General: Alert and Oriented, No Acute Distress HEENT: Pupils equal, round, reactive to light. Conjunctivae clear.  Moist mucous membranes pharynx unremarkable Lungs: Clear and comfortable work of breathing Cardiac: Regular rate and rhythm.\ Right knee exam shows full-strength and range of motion. There is no swelling, redness, nor warmth overlying the knee.  No patellar crepitus. No patellar apprehension. No pain with palpation of the inferior patellar pole.  No pain or laxity with valgus nor varus stress. Anterior drawer is negative. McMurray's negative. No popliteal space tenderness or palpable mass. No medial or lateral joint line tenderness to palpation. Extremities: No peripheral edema.  Strong peripheral pulses.  Mental Status: No depression, anxiety, nor agitation. Skin: Warm and dry.  Assessment & Plan: [name] was seen today for recheck right knee.  Diagnoses and associated orders for this visit:  ADD (attention deficit disorder) - amphetamine-dextroamphetamine (ADDERALL) 20 MG tablet; Take 1 tablet (20 mg total) by mouth 2 (two) times daily.  Patellofemoral arthritis of right knee - meloxicam (MOBIC) 15 MG tablet; Take 1 tablet (15 mg total) by mouth daily.    ADD: Uncontrolled chronic condition increasing Adderall Patellofemoral pain of the right knee is the most likely cause of her right knee pain therefore start home exercise stretching/rehabilitation along with meloxicam. Encouraged to use the compression sleeve provided by her orthopedist  Return in about 3  months (around 11/21/2013).

## 2013-08-26 ENCOUNTER — Telehealth: Payer: Self-pay | Admitting: Family Medicine

## 2013-08-26 DIAGNOSIS — M25561 Pain in right knee: Secondary | ICD-10-CM

## 2013-08-26 NOTE — Telephone Encounter (Signed)
Stacey Alvarado,  please let patient know that I put in an order for an x-ray of the right knee. I like her to see Dr. Karie Schwalbe. at her convenience for a sports medicine referral, it would be most beneficial if she could have this x-ray done just prior to seeing him so that he can review the images with her in person. She can scheule a visit with him at her convenience

## 2013-08-27 NOTE — Telephone Encounter (Signed)
Pt notified; pt has appt tomorrow with Dr. Karie Schwalbe

## 2013-08-28 ENCOUNTER — Encounter: Payer: Self-pay | Admitting: Sports Medicine

## 2013-08-28 ENCOUNTER — Other Ambulatory Visit: Payer: Self-pay | Admitting: Family Medicine

## 2013-08-28 ENCOUNTER — Ambulatory Visit (INDEPENDENT_AMBULATORY_CARE_PROVIDER_SITE_OTHER): Payer: Managed Care, Other (non HMO)

## 2013-08-28 ENCOUNTER — Ambulatory Visit (INDEPENDENT_AMBULATORY_CARE_PROVIDER_SITE_OTHER): Payer: Managed Care, Other (non HMO) | Admitting: Sports Medicine

## 2013-08-28 VITALS — BP 137/88 | HR 93 | Ht 68.0 in | Wt 204.0 lb

## 2013-08-28 DIAGNOSIS — M25561 Pain in right knee: Secondary | ICD-10-CM

## 2013-08-28 DIAGNOSIS — M25469 Effusion, unspecified knee: Secondary | ICD-10-CM

## 2013-08-28 DIAGNOSIS — M2240 Chondromalacia patellae, unspecified knee: Secondary | ICD-10-CM

## 2013-08-28 DIAGNOSIS — M2241 Chondromalacia patellae, right knee: Secondary | ICD-10-CM | POA: Insufficient documentation

## 2013-08-28 NOTE — Assessment & Plan Note (Signed)
Continue Mobic, physical therapy, injection as above. Return to see me in one month.

## 2013-08-28 NOTE — Progress Notes (Signed)
   Subjective:    I'm seeing this patient as a consultation for:  Dr. Ivan AnchorsHommel  CC: Right knee pain  HPI: This is a pleasant 43 year old female, 2 weeks ago she was helping someone who had fallen on the ground. As she was standing up she felt a grinding sensation on her right kneecap. Afterwards it swelled up significantly, and she had significant pain underneath the kneecap worse when going up and down stairs, sitting, and squatting. Pain was moderate, persistent, no catching, locking, swelling has improved significantly. She had been taking ibuprofen, was switched to Mobic, unfortunately didn't have an improvement her pain.  Past medical history, Surgical history, Family history not pertinant except as noted below, Social history, Allergies, and medications have been entered into the medical record, reviewed, and no changes needed.   Review of Systems: No headache, visual changes, nausea, vomiting, diarrhea, constipation, dizziness, abdominal pain, skin rash, fevers, chills, night sweats, weight loss, swollen lymph nodes, body aches, joint swelling, muscle aches, chest pain, shortness of breath, mood changes, visual or auditory hallucinations.   Objective:   General: Well Developed, well nourished, and in no acute distress.  Neuro/Psych: Alert and oriented x3, extra-ocular muscles intact, able to move all 4 extremities, sensation grossly intact. Skin: Warm and dry, no rashes noted.  Respiratory: Not using accessory muscles, speaking in full sentences, trachea midline.  Cardiovascular: Pulses palpable, no extremity edema. Abdomen: Does not appear distended. Right Knee: Only minimal swelling with a very trace fluid wave. Palpation normal with no warmth, joint line tenderness, patellar tenderness, or condyle tenderness. ROM full in flexion and extension and lower leg rotation. Ligaments with solid consistent endpoints including ACL, PCL, LCL, MCL. Negative Mcmurray's, Apley's, and Thessalonian  tests. Painful patellar compression was tenderness to palpation of the lateral patellar facet, with some mild grind. Patellar and quadriceps tendons unremarkable. Hamstring and quadriceps strength is normal.   Procedure: Real-time Ultrasound Guided Injection of right knee Device: GE Logiq E  Verbal informed consent obtained.  Time-out conducted.  Noted no overlying erythema, induration, or other signs of local infection.  Skin prepped in a sterile fashion.  Local anesthesia: Topical Ethyl chloride.  With sterile technique and under real time ultrasound guidance:  2 cc Kenalog 40, 4 cc lidocaine injected easily into the suprapatellar recess, there was only minimal effusion visible. Completed without difficulty  Pain immediately resolved suggesting accurate placement of the medication.  Advised to call if fevers/chills, erythema, induration, drainage, or persistent bleeding.  Images permanently stored and available for review in the ultrasound unit.  Impression: Technically successful ultrasound guided injection.  X-rays were personally reviewed and show irregularity of the chondral cartilage.  Impression and Recommendations:   This case required medical decision making of moderate complexity.

## 2013-09-16 ENCOUNTER — Ambulatory Visit (INDEPENDENT_AMBULATORY_CARE_PROVIDER_SITE_OTHER): Payer: Managed Care, Other (non HMO) | Admitting: Family Medicine

## 2013-09-16 ENCOUNTER — Encounter: Payer: Self-pay | Admitting: Family Medicine

## 2013-09-16 VITALS — BP 135/85 | HR 111 | Wt 202.0 lb

## 2013-09-16 DIAGNOSIS — S139XXA Sprain of joints and ligaments of unspecified parts of neck, initial encounter: Secondary | ICD-10-CM

## 2013-09-16 DIAGNOSIS — F988 Other specified behavioral and emotional disorders with onset usually occurring in childhood and adolescence: Secondary | ICD-10-CM

## 2013-09-16 DIAGNOSIS — S161XXA Strain of muscle, fascia and tendon at neck level, initial encounter: Secondary | ICD-10-CM

## 2013-09-16 MED ORDER — TRAMADOL HCL 50 MG PO TABS
50.0000 mg | ORAL_TABLET | Freq: Three times a day (TID) | ORAL | Status: DC | PRN
Start: 2013-09-16 — End: 2013-10-15

## 2013-09-16 MED ORDER — ORPHENADRINE CITRATE ER 100 MG PO TB12: 100.0000 mg | ORAL_TABLET | Freq: Two times a day (BID) | ORAL | Status: DC

## 2013-09-16 MED ORDER — AMPHETAMINE-DEXTROAMPHETAMINE 20 MG PO TABS: 20.0000 mg | ORAL_TABLET | Freq: Two times a day (BID) | ORAL | Status: DC

## 2013-09-16 NOTE — Progress Notes (Signed)
CC: Stacey Alvarado is a 43 y.o. female is here for neck pain x 3 days   Subjective: HPI:  Neck pain localized to the back of the neck described as stiffness, moderate in severity, worse when looking to the left, right, or upward. 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 Has not been getting better or worse for the past 3 days at a time it began. Pain is nonradiating. No improvement with meloxicam or cold packs. Denies any recent trauma as described above. Denies any arm pain nor motor or sensory disturbances in any extremity. Accompanied by headaches the posterior head. Denies fevers, chills, cough, shortness of breath, chest pain.  Followup ADD: Continues to take Adderall 20 mg twice a day. She says that she has had a fantastic improvement with concentration and ignoring distractions at work since starting this dosage.  Denies known side effects or intolerance. There's been no anxiety or depression nor sleep disturbance   Review Of Systems Outlined In HPI  No past medical history on file.  Past Surgical History  Procedure Laterality Date  . Cholecystectomy    . Abdominal hysterectomy    . Tonsillectomy    . Carpal tunnel release     No family history on file.  History   Social History  . Marital Status: Married    Spouse Name: N/A    Number of Children: N/A  . Years of Education: N/A   Occupational History  . Not on file.   Social History Main Topics  . Smoking status: Current Every Day Smoker    Types: Cigarettes  . Smokeless tobacco: Never Used     Comment: vapor cigs  . Alcohol Use: No  . Drug Use: Not on file  . Sexual Activity: Not on file   Other Topics Concern  . Not on file   Social History Narrative  . No narrative on file     Objective: BP 135/85  Pulse 111  Wt 202 lb (91.627 kg)  General: Alert and Oriented, No Acute Distress HEENT: Pupils equal, round, reactive to light. Conjunctivae  clear.  Moist mucous membranes with pharynx unremarkable area Lungs: Clearing comfortable work of breathing Cardiac: Regular rate and rhythm.  Back: Full flexion of the cervical spine, extension is restricted to approximately 30 above the horizon. Rotation to the right and left is also restricted to about 45 from the midline. Spurling's negative bilaterally. Pain is reproduced with palpation of the inferior occiput and paraspinal musculature in the lower cervical spine. No midline spinous process tenderness. Extremities: No peripheral edema.  Strong peripheral pulses. Full range of motion in both upper extremities with C5/C6 DTR two over four bilaterally and symmetric Mental Status: No depression, anxiety, nor agitation. Skin: Warm and dry.  Assessment & Plan: [name] was seen today for neck pain x 3 days.  Diagnoses and associated orders for this visit:  Neck strain - traMADol (ULTRAM) 50 MG tablet; Take 1 tablet (50 mg total) by mouth every 8 (eight) hours as needed. - orphenadrine (NORFLEX) 100 MG tablet; Take 1 tablet (100 mg total) by mouth 2 (two) times daily.  ADD (attention deficit disorder) - amphetamine-dextroamphetamine (ADDERALL) 20 MG tablet; Take 1 tablet (20 mg total) by mouth 2 (two) times daily.    Neck strain: Start Norflex, range of motion exercises at home which were provided to her on a handout, continue meloxicam, use heating pad as needed. Tramadol to mask  the pain.  Thankfully I do not think her neck pain is coming from skeletal or disc etiology ADD: Controlled continue Adderall 20 mg twice a day   Return if symptoms worsen or fail to improve.

## 2013-10-15 ENCOUNTER — Ambulatory Visit (INDEPENDENT_AMBULATORY_CARE_PROVIDER_SITE_OTHER): Payer: Managed Care, Other (non HMO) | Admitting: Sports Medicine

## 2013-10-15 ENCOUNTER — Encounter: Payer: Self-pay | Admitting: Sports Medicine

## 2013-10-15 VITALS — BP 147/92 | HR 112 | Ht 68.0 in | Wt 206.0 lb

## 2013-10-15 DIAGNOSIS — M5412 Radiculopathy, cervical region: Secondary | ICD-10-CM

## 2013-10-15 DIAGNOSIS — S161XXD Strain of muscle, fascia and tendon at neck level, subsequent encounter: Secondary | ICD-10-CM

## 2013-10-15 MED ORDER — TRAMADOL HCL 50 MG PO TABS: 50.0000 mg | ORAL_TABLET | Freq: Three times a day (TID) | ORAL | Status: DC | PRN

## 2013-10-15 MED ORDER — PREDNISONE 50 MG PO TABS: ORAL_TABLET | ORAL | Status: DC

## 2013-10-15 NOTE — Assessment & Plan Note (Signed)
Right-sided C5. Prednisone, she has already had muscle relaxers, failed physical therapy. She does have a MRI shows multilevel disc protrusions in the mid cervical spine. Cervical spine MRI, in anticipation of a right-sided C4-C5 versus C5-C6 interlaminar epidural.

## 2013-10-15 NOTE — Progress Notes (Signed)
   Subjective:    I'm seeing this patient as a consultation for:  Dr. Ivan AnchorsHommel  CC: Neck pain  HPI: This is a pleasant 43 year old female, she has had pain on and off for several years in her neck. On further questioning approximately 8 years ago she had a cervical spine MRI, she was seen recently and treated with a muscle relaxer and an NSAID which helped a little bit. Unfortunately now starting to have pain radiating around the shoulder blade and down the right upper arm, moderate, persistent. No constitutional symptoms, no bowel or bladder dysfunction.  Past medical history, Surgical history, Family history not pertinant except as noted below, Social history, Allergies, and medications have been entered into the medical record, reviewed, and no changes needed.   Review of Systems: No headache, visual changes, nausea, vomiting, diarrhea, constipation, dizziness, abdominal pain, skin rash, fevers, chills, night sweats, weight loss, swollen lymph nodes, body aches, joint swelling, muscle aches, chest pain, shortness of breath, mood changes, visual or auditory hallucinations.   Objective:   General: Well Developed, well nourished, and in no acute distress.  Neuro/Psych: Alert and oriented x3, extra-ocular muscles intact, able to move all 4 extremities, sensation grossly intact. Skin: Warm and dry, no rashes noted.  Respiratory: Not using accessory muscles, speaking in full sentences, trachea midline.  Cardiovascular: Pulses palpable, no extremity edema. Abdomen: Does not appear distended. Neck: Inspection unremarkable. No palpable stepoffs. Negative Spurling's maneuver. Full neck range of motion Grip strength and sensation normal in bilateral hands Strength good C4 to T1 distribution No sensory change to C4 to T1 Negative Hoffman sign bilaterally Reflexes normal  Impression and Recommendations:   This case required medical decision making of moderate complexity.

## 2013-10-16 ENCOUNTER — Encounter: Payer: Self-pay | Admitting: Family Medicine

## 2013-10-16 ENCOUNTER — Telehealth: Payer: Self-pay | Admitting: *Deleted

## 2013-10-16 NOTE — Telephone Encounter (Signed)
MRI approval thru 10/15/13-01/13/14. [id number]. Corliss SkainsJamie Painter, CMA

## 2013-10-19 ENCOUNTER — Telehealth: Payer: Self-pay | Admitting: Family Medicine

## 2013-10-19 ENCOUNTER — Other Ambulatory Visit: Payer: Self-pay | Admitting: *Deleted

## 2013-10-19 ENCOUNTER — Telehealth: Payer: Self-pay

## 2013-10-19 DIAGNOSIS — F988 Other specified behavioral and emotional disorders with onset usually occurring in childhood and adolescence: Secondary | ICD-10-CM

## 2013-10-19 MED ORDER — GABAPENTIN 300 MG PO CAPS: ORAL_CAPSULE | ORAL | Status: DC

## 2013-10-19 MED ORDER — AMPHETAMINE-DEXTROAMPHETAMINE 20 MG PO TABS: 20.0000 mg | ORAL_TABLET | Freq: Two times a day (BID) | ORAL | Status: DC

## 2013-10-19 NOTE — Telephone Encounter (Signed)
Patient request a Rx for a different pain medication. She stated that Tramadol is not working for her. Rhonda Cunningham,CMA

## 2013-10-19 NOTE — Telephone Encounter (Signed)
Called in gabapentin

## 2013-10-19 NOTE — Telephone Encounter (Signed)
Left message on vm and rx up front 

## 2013-10-19 NOTE — Telephone Encounter (Signed)
Andrea, Rx placed in in-box ready for pickup/faxing.  

## 2013-10-20 ENCOUNTER — Ambulatory Visit (HOSPITAL_BASED_OUTPATIENT_CLINIC_OR_DEPARTMENT_OTHER)
Admission: RE | Admit: 2013-10-20 | Discharge: 2013-10-20 | Disposition: A | Discharge status: Home / Self Care | Payer: Managed Care, Other (non HMO) | Source: Ambulatory Visit | Attending: Sports Medicine | Admitting: Sports Medicine

## 2013-10-20 ENCOUNTER — Telehealth: Payer: Self-pay | Admitting: Sports Medicine

## 2013-10-20 DIAGNOSIS — M5412 Radiculopathy, cervical region: Secondary | ICD-10-CM

## 2013-10-20 DIAGNOSIS — M25519 Pain in unspecified shoulder: Secondary | ICD-10-CM | POA: Insufficient documentation

## 2013-10-20 DIAGNOSIS — M5030 Other cervical disc degeneration, unspecified cervical region: Secondary | ICD-10-CM | POA: Insufficient documentation

## 2013-10-20 NOTE — Telephone Encounter (Signed)
Called patient left a message on patient vm advising that Gabapentin has been called in to her pharmacy. Rhonda Cunningham,CMA

## 2013-10-20 NOTE — Telephone Encounter (Signed)
Try one tramadol every 4 hours, as long as she stays below 6 pills per day she is safe.

## 2013-10-20 NOTE — Telephone Encounter (Signed)
Patient said that she can't take neurontin. It causes headaches and dizziness.  She said that she has tramadol but could she take more than one every 8 hours because it is not lasting 8 hours and is in pain.  She is having MRI today.  Please advise if she can take more often, just until she gets in here to see you on Thursday.  thanks

## 2013-10-20 NOTE — Telephone Encounter (Signed)
Left message on patient vm advising her to take Tramadol every 4 hours as long as she stay below 6 pills a day she is safe. Rhonda Cunningham,CMA

## 2013-10-22 ENCOUNTER — Encounter: Payer: Self-pay | Admitting: Sports Medicine

## 2013-10-22 ENCOUNTER — Ambulatory Visit (INDEPENDENT_AMBULATORY_CARE_PROVIDER_SITE_OTHER): Payer: Managed Care, Other (non HMO) | Admitting: Sports Medicine

## 2013-10-22 VITALS — BP 142/90 | HR 93 | Ht 68.0 in | Wt 206.0 lb

## 2013-10-22 DIAGNOSIS — M5412 Radiculopathy, cervical region: Secondary | ICD-10-CM

## 2013-10-22 MED ORDER — GABAPENTIN 100 MG PO CAPS: 100.0000 mg | ORAL_CAPSULE | Freq: Three times a day (TID) | ORAL | Status: DC

## 2013-10-22 MED ORDER — TRAMADOL HCL 50 MG PO TABS: ORAL_TABLET | ORAL | Status: DC

## 2013-10-22 NOTE — Progress Notes (Signed)
  Subjective:    CC: MRI results  HPI: Stacey Alvarado is a very pleasant 43 year old female, she has cervical radiculopathy, left-sided, predominately C5 in the upper arm and periscapular. She has now failed physical therapy, steroids, NSAIDs, muscle relaxers, she has had epidurals in the distant past however I do not know what level they were performed that, she only had a temporary response. She is currently doing gabapentin and tramadol, has a good response but 300 mg was a bit high for her. Symptoms are moderate, persistent.  Past medical history, Surgical history, Family history not pertinant except as noted below, Social history, Allergies, and medications have been entered into the medical record, reviewed, and no changes needed.   Review of Systems: No fevers, chills, night sweats, weight loss, chest pain, or shortness of breath.   Objective:    General: Well Developed, well nourished, and in no acute distress.  Neuro: Alert and oriented x3, extra-ocular muscles intact, sensation grossly intact.  HEENT: Normocephalic, atraumatic, pupils equal round reactive to light, neck supple, no masses, no lymphadenopathy, thyroid nonpalpable.  Skin: Warm and dry, no rashes. Cardiac: Regular rate and rhythm, no murmurs rubs or gallops, no lower extremity edema.  Respiratory: Clear to auscultation bilaterally. Not using accessory muscles, speaking in full sentences.  MRI shows multilevel degenerative disc disease from C4-C7 but worse at the C5-C6 level with a right paracentral component that does appear to indent the right thecal sac on axial images.  Impression and Recommendations:

## 2013-10-22 NOTE — Assessment & Plan Note (Addendum)
Stacey Alvarado has an MRI that does show C4-C7 degenerative disc disease worse at the C5-C6 level. There is a right-sided component of the disc at, it does appear to indent the thecal sac, and it is likely responsible for right-sided C5 versus C6 radicular symptoms and periscapular pain. She has already failed physical therapy, NSAIDs, muscle relaxers, epidurals. She does not desire to try any further epidurals. At this point I am going to refer her downstairs for consideration of ACDF. In the meantime, 300 mg of gabapentin was too strong, decreasing to 100 mg 3 times a day.

## 2013-10-26 ENCOUNTER — Telehealth: Payer: Self-pay

## 2013-10-26 MED ORDER — METHOCARBAMOL 500 MG PO TABS: 500.0000 mg | ORAL_TABLET | Freq: Three times a day (TID) | ORAL | Status: DC

## 2013-10-26 NOTE — Telephone Encounter (Signed)
Adding a prescription for Robaxin, discontinue Norflex.

## 2013-10-26 NOTE — Telephone Encounter (Signed)
Patient called stated that she was seen in office last week for neck pain, she stated that the Meloxicam  Nor the Tramadol is not helping her after her 12 hour shifts she wants to know if she can get a stronger medication. Zared Knoth,CMA

## 2013-10-26 NOTE — Telephone Encounter (Signed)
Left detailed message on patient mvm advising her that Robaxin was sent to her pharmacy. Celester Lech,CMA

## 2013-10-27 ENCOUNTER — Telehealth: Payer: Self-pay

## 2013-10-27 MED ORDER — TIZANIDINE HCL 4 MG PO TABS: 4.0000 mg | ORAL_TABLET | Freq: Every evening | ORAL | Status: DC

## 2013-10-27 NOTE — Telephone Encounter (Signed)
Patient called stated that she can not take the Robaxin she stated that she had some of that left over and she has taken it but it has not helped any. Rhonda Cunningham,CMA

## 2013-10-27 NOTE — Telephone Encounter (Signed)
Switching to Zanaflex.

## 2013-10-28 NOTE — Telephone Encounter (Signed)
Left message on patient mvm advising her that Zanaflex was sent to her pharmacy. Rhonda Cunningham,CMA

## 2013-10-29 ENCOUNTER — Encounter: Payer: Self-pay | Admitting: Sports Medicine

## 2013-10-29 ENCOUNTER — Ambulatory Visit (INDEPENDENT_AMBULATORY_CARE_PROVIDER_SITE_OTHER): Payer: Managed Care, Other (non HMO) | Admitting: Sports Medicine

## 2013-10-29 VITALS — BP 155/98 | HR 97 | Ht 68.0 in | Wt 206.0 lb

## 2013-10-29 DIAGNOSIS — M5412 Radiculopathy, cervical region: Secondary | ICD-10-CM

## 2013-10-29 MED ORDER — TRAMADOL-ACETAMINOPHEN 37.5-325 MG PO TABS: 1.0000 | ORAL_TABLET | Freq: Three times a day (TID) | ORAL | Status: DC | PRN

## 2013-10-29 NOTE — Progress Notes (Signed)
  Subjective:    CC: Followup  HPI: Cervical degenerative disc disease: Stacey Alvarado returns, she continues to have persistent pain, she has declined repeat epidurals, we have been treating her with gabapentin, 300 mg was too strong so we switched down to 100 mg, she declined Flexeril, NSAIDs have been ineffective, tramadol at 300 total milligrams per day has been ineffective, we have tried Robaxin which was effective, she has not yet picked up her Zanaflex, Norflex is also ineffective. Symptoms are moderate, persistent. She does have an appointment coming up in 2 weeks with neurosurgery.  Past medical history, Surgical history, Family history not pertinant except as noted below, Social history, Allergies, and medications have been entered into the medical record, reviewed, and no changes needed.   Review of Systems: No fevers, chills, night sweats, weight loss, chest pain, or shortness of breath.   Objective:    General: Well Developed, well nourished, and in no acute distress.  Neuro: Alert and oriented x3, extra-ocular muscles intact, sensation grossly intact.  HEENT: Normocephalic, atraumatic, pupils equal round reactive to light, neck supple, no masses, no lymphadenopathy, thyroid nonpalpable.  Skin: Warm and dry, no rashes. Cardiac: Regular rate and rhythm, no murmurs rubs or gallops, no lower extremity edema.  Respiratory: Clear to auscultation bilaterally. Not using accessory muscles, speaking in full sentences. Neck: Negative spurling's Full neck range of motion Grip strength and sensation normal in bilateral hands Strength good C4 to T1 distribution No sensory change to C4 to T1 Reflexes normal  Impression and Recommendations:

## 2013-10-29 NOTE — Assessment & Plan Note (Signed)
Appointment with neurosurgery in 2 weeks. No response to multiple oral medications. Adding Ultracet, she needs to go pick up Zanaflex, diclofenac patches given in samples. Return as needed. Again we reiterated that we will not be using narcotics for chronic discogenic pain. I have also advised her that she can let me know if she would like to proceed with another epidural in the meantime.

## 2013-11-05 ENCOUNTER — Telehealth: Payer: Self-pay | Admitting: Sports Medicine

## 2013-11-05 MED ORDER — KETOROLAC TROMETHAMINE 10 MG PO TABS: 10.0000 mg | ORAL_TABLET | Freq: Four times a day (QID) | ORAL | Status: DC | PRN

## 2013-11-05 NOTE — Telephone Encounter (Signed)
I will call in oral Toradol, this is not tramadol, and is what will be typically given the office in injectable form, as the pain killing effect of 10 mg of morphine. It can only be taken for 5 days however.

## 2013-11-05 NOTE — Telephone Encounter (Signed)
Patient is in horrible pain and needs to know if there is something you can suggest for her to do between now and Tuesday.  Is there anywhere she can go or anything you can give her to help with the pain until Tuesday?  thanks

## 2013-11-10 ENCOUNTER — Telehealth: Payer: Self-pay | Admitting: *Deleted

## 2013-11-10 DIAGNOSIS — M5412 Radiculopathy, cervical region: Secondary | ICD-10-CM

## 2013-11-10 NOTE — Telephone Encounter (Signed)
Pt called and left a message that she wants to pick up a refill on her tramadol rx this Friday. This medication is not due until 7/29. Also pt wants to know if Dr. Ivan AnchorsHommel will write a rx for tramadol since she is still in pain. Pt states the tramadol does not work for her but she would like a rx. She also states that Dr. Karie Schwalbe mentioned she may need a referral to a pain clinic. Pt wants to know if a referral can be placed for this.I left her a message on her vm that she can pick up her adderall rx next wed when it is due. Also states on vm that Dr. Ivan AnchorsHommel will not be writing a rx for tramadol since it was noted in her chart that this medication was ineffective. Also reiterated on vm that Dr. Karie Schwalbe had reccomended the epidural injections which she declined. I did state on vm that we can place a referral for pain management as she requested in the vm

## 2013-11-10 NOTE — Telephone Encounter (Signed)
Referral has been placed. 

## 2013-11-11 ENCOUNTER — Telehealth: Payer: Self-pay | Admitting: *Deleted

## 2013-11-11 NOTE — Telephone Encounter (Signed)
Pt wanted to pick up a rx for adderall this week. The adderall rx is not due until next week which is what I told her. She also requested tramadol and I left her a message yesterday when I called her that since the tramadol was ineffective Dr. Ivan AnchorsHommel wouldn't rx this and we could place a referral to a pain clinic as she requested

## 2013-11-17 ENCOUNTER — Telehealth: Payer: Self-pay | Admitting: *Deleted

## 2013-11-17 DIAGNOSIS — M5412 Radiculopathy, cervical region: Secondary | ICD-10-CM

## 2013-11-17 MED ORDER — TRAMADOL-ACETAMINOPHEN 37.5-325 MG PO TABS: 1.0000 | ORAL_TABLET | Freq: Three times a day (TID) | ORAL | Status: DC | PRN

## 2013-11-17 NOTE — Telephone Encounter (Signed)
Pt states she already has ultracet and it didn't work. Pt asked for tramadol and I told her that it was noted in her chart that it was ineffective and ultracet is tramadol and acetamenophin. Advised she can pick up the adderall rx tomorrow

## 2013-11-17 NOTE — Telephone Encounter (Signed)
Pt called wanting to know if Dr. Ivan AnchorsHommel can rx medication until pain clinic contacts her. It appears as of yesterday notes have been sent and [facility] Hashimot[facility] called to check on status and once they review notes they will let the patient know and/or us if she meets their criteria.

## 2013-11-17 NOTE — Telephone Encounter (Signed)
Yeah, I don't have much experience with pain management but I'd be willing to provide an Rx for ultracet. (printed in inbox). I don't prescribe narcotics beyond this.

## 2013-11-18 ENCOUNTER — Telehealth: Payer: Self-pay | Admitting: Sports Medicine

## 2013-11-19 ENCOUNTER — Telehealth: Payer: Self-pay | Admitting: *Deleted

## 2013-11-19 DIAGNOSIS — F988 Other specified behavioral and emotional disorders with onset usually occurring in childhood and adolescence: Secondary | ICD-10-CM

## 2013-11-19 MED ORDER — AMPHETAMINE-DEXTROAMPHETAMINE 20 MG PO TABS: 20.0000 mg | ORAL_TABLET | Freq: Two times a day (BID) | ORAL | Status: DC

## 2013-11-19 NOTE — Telephone Encounter (Signed)
rx

## 2013-12-17 ENCOUNTER — Telehealth: Payer: Self-pay | Admitting: Family Medicine

## 2013-12-17 DIAGNOSIS — F988 Other specified behavioral and emotional disorders with onset usually occurring in childhood and adolescence: Secondary | ICD-10-CM

## 2013-12-17 MED ORDER — AMPHETAMINE-DEXTROAMPHETAMINE 20 MG PO TABS: 20.0000 mg | ORAL_TABLET | Freq: Two times a day (BID) | ORAL | Status: DC

## 2013-12-17 NOTE — Telephone Encounter (Signed)
Patient needs a refill on her adderall. thanks

## 2013-12-17 NOTE — Telephone Encounter (Signed)
Andrea, Rx placed in in-box ready for pickup/faxing.  

## 2013-12-17 NOTE — Telephone Encounter (Signed)
rx up front 

## 2014-01-11 ENCOUNTER — Other Ambulatory Visit: Payer: Self-pay | Admitting: *Deleted

## 2014-01-11 MED ORDER — DULOXETINE HCL 60 MG PO CPEP: 60.0000 mg | ORAL_CAPSULE | Freq: Every day | ORAL | Status: DC

## 2014-01-14 ENCOUNTER — Telehealth: Payer: Self-pay | Admitting: Family Medicine

## 2014-01-14 DIAGNOSIS — F988 Other specified behavioral and emotional disorders with onset usually occurring in childhood and adolescence: Secondary | ICD-10-CM

## 2014-01-14 MED ORDER — AMPHETAMINE-DEXTROAMPHETAMINE 20 MG PO TABS: 20.0000 mg | ORAL_TABLET | Freq: Two times a day (BID) | ORAL | Status: DC

## 2014-01-14 NOTE — Telephone Encounter (Signed)
Andrea, Rx placed in in-box ready for pickup/faxing.  

## 2014-01-14 NOTE — Telephone Encounter (Signed)
Message left on vm 

## 2014-01-14 NOTE — Telephone Encounter (Signed)
Patient needs rx for her adderall and will pick up tomorrow.  thanks

## 2014-01-27 ENCOUNTER — Telehealth: Payer: Self-pay | Admitting: *Deleted

## 2014-01-27 NOTE — Telephone Encounter (Signed)
Patient calling to cancel appointment on 01/29/14 with Dr Frances FurbishAthar, patient states that she will r/s once her work schedule permits.

## 2014-01-29 ENCOUNTER — Institutional Professional Consult (permissible substitution): Payer: Self-pay | Admitting: Neurology

## 2014-02-09 ENCOUNTER — Other Ambulatory Visit: Payer: Self-pay | Admitting: *Deleted

## 2014-02-09 DIAGNOSIS — M5412 Radiculopathy, cervical region: Secondary | ICD-10-CM

## 2014-02-09 MED ORDER — GABAPENTIN 100 MG PO CAPS: 100.0000 mg | ORAL_CAPSULE | Freq: Three times a day (TID) | ORAL | Status: DC

## 2014-02-19 ENCOUNTER — Other Ambulatory Visit: Payer: Self-pay | Admitting: *Deleted

## 2014-02-19 DIAGNOSIS — F988 Other specified behavioral and emotional disorders with onset usually occurring in childhood and adolescence: Secondary | ICD-10-CM

## 2014-02-19 MED ORDER — AMPHETAMINE-DEXTROAMPHETAMINE 20 MG PO TABS: 20.0000 mg | ORAL_TABLET | Freq: Two times a day (BID) | ORAL | Status: DC

## 2014-03-17 ENCOUNTER — Telehealth: Payer: Self-pay | Admitting: Family Medicine

## 2014-03-17 DIAGNOSIS — F988 Other specified behavioral and emotional disorders with onset usually occurring in childhood and adolescence: Secondary | ICD-10-CM

## 2014-03-17 MED ORDER — AMPHETAMINE-DEXTROAMPHETAMINE 20 MG PO TABS: 20.0000 mg | ORAL_TABLET | Freq: Two times a day (BID) | ORAL | Status: DC

## 2014-03-17 NOTE — Telephone Encounter (Signed)
Stacey Alvarado, Rx placed in in-box ready for pickup/faxing.  

## 2014-03-17 NOTE — Telephone Encounter (Signed)
Patient will be out of adderall on Sat.  Can you please print her script today?  thanks

## 2014-03-17 NOTE — Telephone Encounter (Signed)
Rx has been placed up front for pickup. Auriana Scalia,CMA

## 2014-04-12 ENCOUNTER — Telehealth: Payer: Self-pay | Admitting: *Deleted

## 2014-04-13 MED ORDER — DULOXETINE HCL 60 MG PO CPEP: 60.0000 mg | ORAL_CAPSULE | Freq: Every day | ORAL | Status: DC

## 2014-04-13 NOTE — Telephone Encounter (Signed)
Refill has been sent to her pharmacy at [facility], follow-up within the next 30 days

## 2014-04-13 NOTE — Telephone Encounter (Signed)
Patient would like a refill of cymbalta can she be given 30 days and be scheduled a follow up appt?

## 2014-04-13 NOTE — Telephone Encounter (Signed)
Patient notified

## 2014-04-20 ENCOUNTER — Telehealth: Payer: Self-pay | Admitting: Family Medicine

## 2014-04-20 DIAGNOSIS — F988 Other specified behavioral and emotional disorders with onset usually occurring in childhood and adolescence: Secondary | ICD-10-CM

## 2014-04-20 MED ORDER — AMPHETAMINE-DEXTROAMPHETAMINE 20 MG PO TABS: 20.0000 mg | ORAL_TABLET | Freq: Two times a day (BID) | ORAL | Status: DC

## 2014-04-20 NOTE — Telephone Encounter (Signed)
Patient is requesting refill on adderall.  I have informed her that it has been 6 months since last visit with you regarding meds.    Thanks.

## 2014-04-20 NOTE — Telephone Encounter (Signed)
Voicemail left letting her know that script was ready for pick up and that she needed to schedule follow up appt.

## 2014-04-20 NOTE — Telephone Encounter (Signed)
Stacey Alvarado, Rx placed in in-box ready for pickup/faxing. Contains recommendation of following up for future refills

## 2014-05-06 ENCOUNTER — Encounter: Payer: Self-pay | Admitting: Family Medicine

## 2014-05-06 ENCOUNTER — Ambulatory Visit (INDEPENDENT_AMBULATORY_CARE_PROVIDER_SITE_OTHER): Payer: Managed Care, Other (non HMO) | Admitting: Family Medicine

## 2014-05-06 VITALS — BP 138/94 | HR 102 | Ht 68.0 in | Wt 214.0 lb

## 2014-05-06 DIAGNOSIS — M2550 Pain in unspecified joint: Secondary | ICD-10-CM

## 2014-05-06 DIAGNOSIS — F329 Major depressive disorder, single episode, unspecified: Secondary | ICD-10-CM

## 2014-05-06 DIAGNOSIS — F909 Attention-deficit hyperactivity disorder, unspecified type: Secondary | ICD-10-CM

## 2014-05-06 DIAGNOSIS — R5382 Chronic fatigue, unspecified: Secondary | ICD-10-CM

## 2014-05-06 DIAGNOSIS — E038 Other specified hypothyroidism: Secondary | ICD-10-CM

## 2014-05-06 DIAGNOSIS — F32A Depression, unspecified: Secondary | ICD-10-CM

## 2014-05-06 DIAGNOSIS — M5412 Radiculopathy, cervical region: Secondary | ICD-10-CM

## 2014-05-06 DIAGNOSIS — K130 Diseases of lips: Secondary | ICD-10-CM

## 2014-05-06 DIAGNOSIS — F988 Other specified behavioral and emotional disorders with onset usually occurring in childhood and adolescence: Secondary | ICD-10-CM

## 2014-05-06 LAB — VITAMIN B12: Vitamin B-12: 430 pg/mL (ref 211–911)

## 2014-05-06 LAB — TSH: TSH: 1.935 u[IU]/mL (ref 0.350–4.500)

## 2014-05-06 MED ORDER — DULOXETINE HCL 20 MG PO CPEP: 20.0000 mg | ORAL_CAPSULE | Freq: Every day | ORAL | Status: DC

## 2014-05-06 MED ORDER — SERTRALINE HCL 100 MG PO TABS: 100.0000 mg | ORAL_TABLET | Freq: Every day | ORAL | Status: DC

## 2014-05-06 MED ORDER — TRIAMCINOLONE ACETONIDE 0.1 % EX OINT: 1.0000 "application " | TOPICAL_OINTMENT | Freq: Two times a day (BID) | CUTANEOUS | Status: DC

## 2014-05-06 NOTE — Progress Notes (Signed)
CC: CHARLAYNE VULTAGGIO is a 44 y.o. female is here for ADHD and Depression   Subjective: HPI:  Follow-up ADD: Continues to take Adderall 20 mg twice a day. 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 She's noticed that her blood pressure is higher than 140/90 about 1 hour after taking a dose of Adderall.  She denies any other known side effects or intolerance. Denies paranoia, anxiety, nor appetite suppression    follow-up depression: Patient states that she's felt more down lately and subjectively depressed. She tells me she was switched to Cymbalta sometime in the past to help with both depression and possibly fibromyalgia however it has not helped any of her pain and seems to be less effective than Zoloft in the past. There have been no toxoid harm herself or others  Complains of fatigue has been present on a daily basis for at least 3 months now. Nothing particular seems to make it better or worse. She describes it as decreased energy but no difficulty with strength. She denies any other motor or sensory disturbances. Present all hours of the day but worse first thing in the morning. She used to have a vitamin B12 deficiency but no longer is taking any supplements.  She is wondering if she can be referred to see Dr. Oneal Grout for pain management as she has a family member who sees her and has had good results.  Complains of cracking of the lips localized in the corners of the lips that has been present on a daily basis for at least the last 2 weeks. No benefit from Vaseline or any other lip balm. Described as cracking and painful worse with opening the mouth wide. Nothing else Worse  Review Of Systems Outlined In HPI  No past medical history on file.  Past Surgical History  Procedure Laterality Date  . Cholecystectomy    . Abdominal hysterectomy    . Tonsillectomy    . Carpal tunnel  release     No family history on file.  History   Social History  . Marital Status: Married    Spouse Name: N/A    Number of Children: N/A  . Years of Education: N/A   Occupational History  . Not on file.   Social History Main Topics  . Smoking status: Current Every Day Smoker    Types: Cigarettes  . Smokeless tobacco: Never Used     Comment: vapor cigs  . Alcohol Use: No  . Drug Use: Not on file  . Sexual Activity: Not on file   Other Topics Concern  . Not on file   Social History Narrative  . No narrative on file     Objective: BP 138/94 mmHg  Pulse 102  Ht  (1.727 m)  Wt 214 lb (97.07 kg)  BMI 32.55 kg/m2  SpO2 97%  General: Alert and Oriented, No Acute Distress HEENT: Pupils equal, round, reactive to light. Conjunctivae clear.   with mucous membranes Lungs: Clear and comfortable work of breathing Cardiac: Regular rate and rhythm.  Extremities: No peripheral edema.  Strong peripheral pulses.  Mental Status: Flat affect appears mildly depressed without anxiousness for agitation Skin: Warm and dry. Mild hyperkeratosis with mild erythema at the corners of both lips   Assessment & Plan: Shellie was seen today for adhd and depression.  Diagnoses and associated orders for this visit:  ADD (attention deficit disorder)  Depression - DULoxetine (CYMBALTA) 20 MG capsule; Take 1 capsule (20 mg total) by mouth daily. Only for one week then start zoloft. - sertraline (ZOLOFT) 100 MG tablet; Take 1 tablet (100 mg total) by mouth daily.  Other specified hypothyroidism  Lip pain - triamcinolone ointment (KENALOG) 0.1 %; Apply 1 application topically 2 (two) times daily. As needed for cracking of the lips.  Chronic fatigue - TSH - B12 - Vit D  25 hydroxy (rtn osteoporosis monitoring)  Cervical radiculitis - Ambulatory referral to Pain Clinic  Multiple joint pain - Ambulatory referral to Pain Clinic    ADD: Controlled however this could be causing  elevated blood pressures. I've asked her to keep a journal of what her blood pressure is when she takes half a dose in the morning and half a dose in the afternoon and then compare this to another week of blood pressure readings when she goes back to a full dose of 20 mg. Drop off results to my office for feedback.  Depression: Uncontrolled, tapering off of Cymbalta with 20 mg formulation and begin Zoloft at 100 mg daily Fatigue: Rule out hyperthyroidism B12 deficiency or vitamin D deficiency Begin triamcinolone for chapped lips and angular inflammation Referral has been placed to see Dr. Oneal GroutGarvin for cervical radiculitis and diffuse joint pain       Return in about 3 months (around 08/05/2014).

## 2014-05-07 ENCOUNTER — Telehealth: Payer: Self-pay | Admitting: Family Medicine

## 2014-05-07 DIAGNOSIS — E559 Vitamin D deficiency, unspecified: Secondary | ICD-10-CM | POA: Insufficient documentation

## 2014-05-07 LAB — VITAMIN D 25 HYDROXY (VIT D DEFICIENCY, FRACTURES): VIT D 25 HYDROXY: 22 ng/mL — AB (ref 30–100)

## 2014-05-07 MED ORDER — VITAMIN D (ERGOCALCIFEROL) 1.25 MG (50000 UNIT) PO CAPS: 50000.0000 [IU] | ORAL_CAPSULE | ORAL | Status: DC

## 2014-05-07 NOTE — Telephone Encounter (Signed)
Pt.notified

## 2014-05-07 NOTE — Telephone Encounter (Signed)
Stacey Alvarado, Will you please let patient know that her vitamin B12 and thyroid function was normal however she is deficient of vitamin D which could be causing her fatigue.  I will send a weekly supplement to her [facility], we'll want to recheck this in three months.

## 2014-05-18 ENCOUNTER — Telehealth: Payer: Self-pay | Admitting: Family Medicine

## 2014-05-18 DIAGNOSIS — F988 Other specified behavioral and emotional disorders with onset usually occurring in childhood and adolescence: Secondary | ICD-10-CM

## 2014-05-18 MED ORDER — AMPHETAMINE-DEXTROAMPHETAMINE 20 MG PO TABS: 20.0000 mg | ORAL_TABLET | Freq: Two times a day (BID) | ORAL | Status: DC

## 2014-05-18 NOTE — Telephone Encounter (Signed)
Andrea, Rx placed in in-box ready for pickup/faxing.  

## 2014-05-18 NOTE — Telephone Encounter (Signed)
Patient needs refill on adderall.  thanks

## 2014-05-18 NOTE — Telephone Encounter (Signed)
rx up front 

## 2014-06-18 ENCOUNTER — Telehealth: Payer: Self-pay | Admitting: Family Medicine

## 2014-06-18 DIAGNOSIS — F988 Other specified behavioral and emotional disorders with onset usually occurring in childhood and adolescence: Secondary | ICD-10-CM

## 2014-06-18 MED ORDER — AMPHETAMINE-DEXTROAMPHETAMINE 20 MG PO TABS: 20.0000 mg | ORAL_TABLET | Freq: Two times a day (BID) | ORAL | Status: DC

## 2014-06-18 NOTE — Telephone Encounter (Signed)
rx up front 

## 2014-06-18 NOTE — Telephone Encounter (Signed)
Stacey Alvarado, Rx placed in in-box ready for pickup/faxing.  

## 2014-06-18 NOTE — Telephone Encounter (Signed)
Patient requesting to have her adderall refilled and i will advise her when rx is signed.  thanks

## 2014-06-21 ENCOUNTER — Telehealth: Payer: Self-pay | Admitting: Family Medicine

## 2014-06-21 DIAGNOSIS — F988 Other specified behavioral and emotional disorders with onset usually occurring in childhood and adolescence: Secondary | ICD-10-CM

## 2014-06-21 MED ORDER — AMPHETAMINE-DEXTROAMPHETAMINE 20 MG PO TABS: 20.0000 mg | ORAL_TABLET | Freq: Two times a day (BID) | ORAL | Status: DC

## 2014-06-21 NOTE — Telephone Encounter (Signed)
Rx missing in our office, new Rx given to tammy

## 2014-07-08 ENCOUNTER — Ambulatory Visit (INDEPENDENT_AMBULATORY_CARE_PROVIDER_SITE_OTHER): Payer: Managed Care, Other (non HMO) | Admitting: Family Medicine

## 2014-07-08 ENCOUNTER — Encounter: Payer: Self-pay | Admitting: Family Medicine

## 2014-07-08 VITALS — BP 146/90 | HR 96 | Temp 98.5°F | Wt 210.0 lb

## 2014-07-08 DIAGNOSIS — J020 Streptococcal pharyngitis: Secondary | ICD-10-CM

## 2014-07-08 MED ORDER — AMOXICILLIN-POT CLAVULANATE 500-125 MG PO TABS: ORAL_TABLET | ORAL | Status: AC

## 2014-07-08 NOTE — Progress Notes (Signed)
CC: Stacey Alvarado is a 44 y.o. female is here for Fever and Sore Throat   Subjective: HPI:  Planes of acute onset sore throat that began on Monday morning accompanied by a fever of 103.0 both of which are slightly improved with taking ibuprofen 3 times a day. Symptoms are still moderate in severity present all hours of the day. Accompanied by mild nasal congestion and fatigue with body aches but no cough wheezing or shortness of breath. No interventions other than above. Fatigue is present but only mild in severity. Denies facial pressure, headache, rash, choking, abdominal pain. Pain is described only has pain in the throat and nonradiating    Review Of Systems Outlined In HPI  No past medical history on file.  Past Surgical History  Procedure Laterality Date  . Cholecystectomy    . Abdominal hysterectomy    . Tonsillectomy    . Carpal tunnel release     No family history on file.  History   Social History  . Marital Status: Married    Spouse Name: N/A  . Number of Children: N/A  . Years of Education: N/A   Occupational History  . Not on file.   Social History Main Topics  . Smoking status: Current Every Day Smoker    Types: Cigarettes  . Smokeless tobacco: Never Used     Comment: vapor cigs  . Alcohol Use: No  . Drug Use: Not on file  . Sexual Activity: Not on file   Other Topics Concern  . Not on file   Social History Narrative     Objective: BP 146/90 mmHg  Pulse 96  Temp(Src) 98.5 F (36.9 C) (Oral)  Wt 210 lb (95.255 kg)  General: Alert and Oriented, No Acute Distress HEENT: Pupils equal, round, reactive to light. Conjunctivae clear.  External ears unremarkable, canals clear with intact TMs with appropriate landmarks.  Middle ear appears open without effusion. Pink inferior turbinates.  Moist mucous membranes, pharynx without lesions other than moderate inflammation, absent tonsils and uvula is midline.  Neck with mild left anterior chain  lymphadenopathy Lungs: Clear to auscultation bilaterally, no wheezing/ronchi/rales.  Comfortable work of breathing. Good air movement. Cardiac: Regular rate and rhythm. Normal S1/S2.  No murmurs, rubs, nor gallops.   Extremities: No peripheral edema.  Strong peripheral pulses.  Mental Status: No depression, anxiety, nor agitation. Skin: Warm and dry.  Assessment & Plan: Stacey Alvarado was seen today for fever and sore throat.  Diagnoses and all orders for this visit:  Strep pharyngitis Orders: -     amoxicillin-clavulanate (AUGMENTIN) 500-125 MG per tablet; Take one by mouth every 8 hours for ten total days.   With known exposure to strep and her clinical history suspicion for strep pharyngitis is high therefore start Augmentin. Ibuprofen should be continued focus on staying well-hydrated. Out of work Monday through Thursday of this week no provided if still not feeling better by tomorrow call for another note.   Return if symptoms worsen or fail to improve.

## 2014-07-21 ENCOUNTER — Telehealth: Payer: Self-pay | Admitting: Family Medicine

## 2014-07-21 DIAGNOSIS — F988 Other specified behavioral and emotional disorders with onset usually occurring in childhood and adolescence: Secondary | ICD-10-CM

## 2014-07-21 MED ORDER — AMPHETAMINE-DEXTROAMPHETAMINE 20 MG PO TABS: 20.0000 mg | ORAL_TABLET | Freq: Two times a day (BID) | ORAL | Status: DC

## 2014-07-21 NOTE — Telephone Encounter (Signed)
Patient is requesting her adderall refill and would like to pick up tomorrow.thanks

## 2014-07-21 NOTE — Telephone Encounter (Signed)
rx up front 

## 2014-07-21 NOTE — Telephone Encounter (Signed)
Stacey Alvarado, Rx placed in in-box ready for pickup/faxing.  

## 2014-08-02 NOTE — Telephone Encounter (Signed)
close

## 2014-08-23 ENCOUNTER — Telehealth: Payer: Self-pay | Admitting: Family Medicine

## 2014-08-23 DIAGNOSIS — F988 Other specified behavioral and emotional disorders with onset usually occurring in childhood and adolescence: Secondary | ICD-10-CM

## 2014-08-23 MED ORDER — AMPHETAMINE-DEXTROAMPHETAMINE 20 MG PO TABS: 20.0000 mg | ORAL_TABLET | Freq: Two times a day (BID) | ORAL | Status: DC

## 2014-08-23 NOTE — Telephone Encounter (Signed)
Patient would like to pick up her adderall script today please and thank you.  If you give me the rx, I will call the patient.

## 2014-08-23 NOTE — Telephone Encounter (Signed)
RX IS UP FRONT PER TAMMY. SHE HAS NOTIFIED MOM.

## 2014-08-24 ENCOUNTER — Ambulatory Visit (INDEPENDENT_AMBULATORY_CARE_PROVIDER_SITE_OTHER): Payer: Managed Care, Other (non HMO) | Admitting: Family Medicine

## 2014-08-24 ENCOUNTER — Encounter: Payer: Self-pay | Admitting: Family Medicine

## 2014-08-24 VITALS — BP 144/86 | HR 89 | Temp 99.3°F | Ht 68.0 in | Wt 209.0 lb

## 2014-08-24 DIAGNOSIS — F32A Depression, unspecified: Secondary | ICD-10-CM

## 2014-08-24 DIAGNOSIS — J208 Acute bronchitis due to other specified organisms: Principal | ICD-10-CM

## 2014-08-24 DIAGNOSIS — J00 Acute nasopharyngitis [common cold]: Secondary | ICD-10-CM

## 2014-08-24 DIAGNOSIS — F329 Major depressive disorder, single episode, unspecified: Secondary | ICD-10-CM | POA: Diagnosis not present

## 2014-08-24 DIAGNOSIS — B9689 Other specified bacterial agents as the cause of diseases classified elsewhere: Secondary | ICD-10-CM

## 2014-08-24 MED ORDER — DOXYCYCLINE HYCLATE 100 MG PO TABS: 100.0000 mg | ORAL_TABLET | Freq: Two times a day (BID) | ORAL | Status: DC

## 2014-08-24 MED ORDER — SERTRALINE HCL 100 MG PO TABS: 100.0000 mg | ORAL_TABLET | Freq: Every day | ORAL | Status: DC

## 2014-08-24 NOTE — Progress Notes (Signed)
CC: Stacey Alvarado is a 44 y.o. female is here for coughing wheezing /fever   Subjective: HPI:  Cough present for the last 3 days moderate severity present all hours of the day not interfering with sleep. No response to albuterol nebulizer or inhaled corticosteroid that she had at home. Accompanied by mild wheezing. Mild shortness of breath from baseline however this is only present with activity and never at rest. No other interventions other than above. She continues to smoke. Review of systems positive for night sweats and chest wall pain in the right lateral chest wall with coughing.. Denies fevers, chills, sore throat, nasal congestion, headache or facial pain  Requesting refills on Zoloft. She is taking on 2 mg daily without any thoughts of wanting to harm herself or others. She tells me it's helped with no longer having crying spells. She feels like it's probably working because she really doesn't have any subjective anxiety or depression or any other mental complaints.   Review Of Systems Outlined In HPI  No past medical history on file.  Past Surgical History  Procedure Laterality Date  . Cholecystectomy    . Abdominal hysterectomy    . Tonsillectomy    . Carpal tunnel release     No family history on file.  History   Social History  . Marital Status: Married    Spouse Name: N/A  . Number of Children: N/A  . Years of Education: N/A   Occupational History  . Not on file.   Social History Main Topics  . Smoking status: Current Every Day Smoker    Types: Cigarettes  . Smokeless tobacco: Never Used     Comment: vapor cigs  . Alcohol Use: No  . Drug Use: Not on file  . Sexual Activity: Not on file   Other Topics Concern  . Not on file   Social History Narrative     Objective: BP 144/86 mmHg  Pulse 89  Temp(Src) 99.3 F (37.4 C)  Ht 5\' 8"  (1.727 m)  Wt 209 lb (94.802 kg)  BMI 31.79 kg/m2  SpO2 99%  General: Alert and Oriented, No Acute Distress HEENT:  Pupils equal, round, reactive to light. Conjunctivae clear.  External ears unremarkable, canals clear with intact TMs with appropriate landmarks.  Middle ear appears open without effusion. Pink inferior turbinates.  Moist mucous membranes, pharynx without inflammation nor lesions.  Neck supple without palpable lymphadenopathy nor abnormal masses. Lungs: Trace rhonchi while exhaling in the right upper lung lobe. No wheezing or rales. No signs of consolidation Cardiac: Regular rate and rhythm. Normal S1/S2.  No murmurs, rubs, nor gallops.   Mental Status: No depression, anxiety, nor agitation. Skin: Warm and dry.  Assessment & Plan: Stacey Alvarado was seen today for coughing wheezing /fever.  Diagnoses and all orders for this visit:  Acute bacterial bronchitis Orders: -     doxycycline (VIBRA-TABS) 100 MG tablet; Take 1 tablet (100 mg total) by mouth 2 (two) times daily.  Depression Orders: -     sertraline (ZOLOFT) 100 MG tablet; Take 1 tablet (100 mg total) by mouth daily.   Bacterial bronchitis: Start doxycycline discussed signs and symptoms of being more suggestive of pneumonia or a blood clot that would require emergent reevaluation. Call if no better with respect to wheezing on Thursday and if needed I be happy to send in a burst of prednisone Depression: Controlled continue Zoloft  Return if symptoms worsen or fail to improve.

## 2014-08-25 ENCOUNTER — Ambulatory Visit: Payer: Managed Care, Other (non HMO) | Admitting: Family Medicine

## 2014-09-17 ENCOUNTER — Telehealth: Payer: Self-pay | Admitting: Family Medicine

## 2014-09-17 DIAGNOSIS — F988 Other specified behavioral and emotional disorders with onset usually occurring in childhood and adolescence: Secondary | ICD-10-CM

## 2014-09-17 NOTE — Telephone Encounter (Signed)
Patient is requesting refill on her adderall and would like to pick up on Tuesday.  If you will let me know when it is ready, I will contact her. thanks

## 2014-09-17 NOTE — Telephone Encounter (Signed)
Not due until thurs

## 2014-09-21 MED ORDER — AMPHETAMINE-DEXTROAMPHETAMINE 20 MG PO TABS
20.0000 mg | ORAL_TABLET | Freq: Two times a day (BID) | ORAL | Status: DC
Start: 2014-09-21 — End: 2014-10-18

## 2014-09-21 NOTE — Telephone Encounter (Signed)
Stacey Alvarado, Rx placed in in-box ready for pickup/faxing, ok for her to pickup today.

## 2014-10-09 IMAGING — CR DG KNEE COMPLETE 4+V*R*
5 series · 5 of 5 positions shown · non-contrast
Comparison: None.

CLINICAL DATA: Pain and swelling right knee

EXAM:
RIGHT KNEE - COMPLETE 4+ VIEW

[view not recorded (1 of 5)]
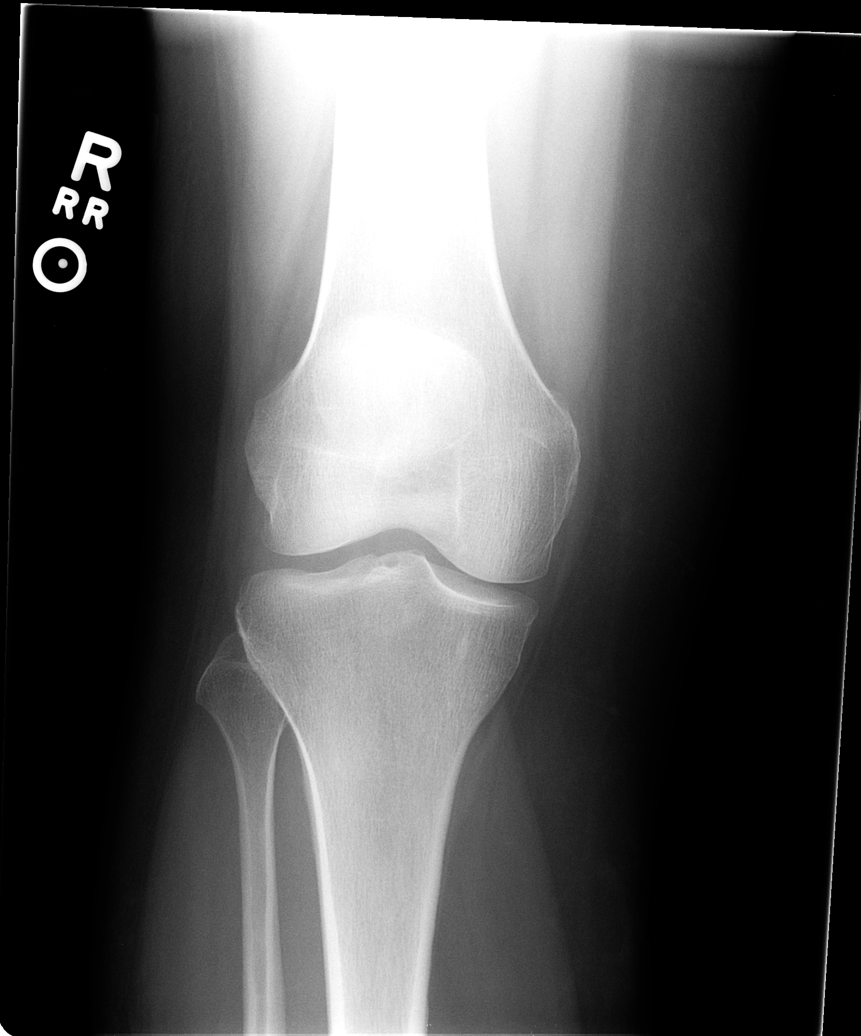

[view not recorded (2 of 5)]
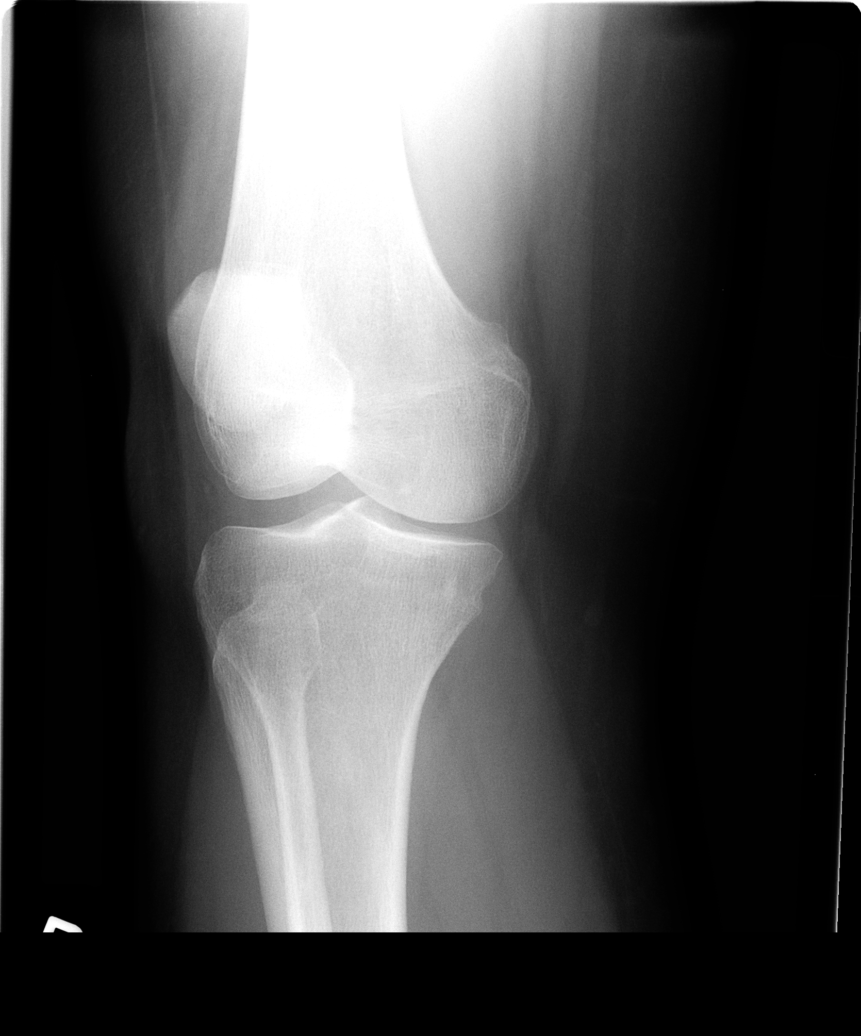

[view not recorded (3 of 5)]
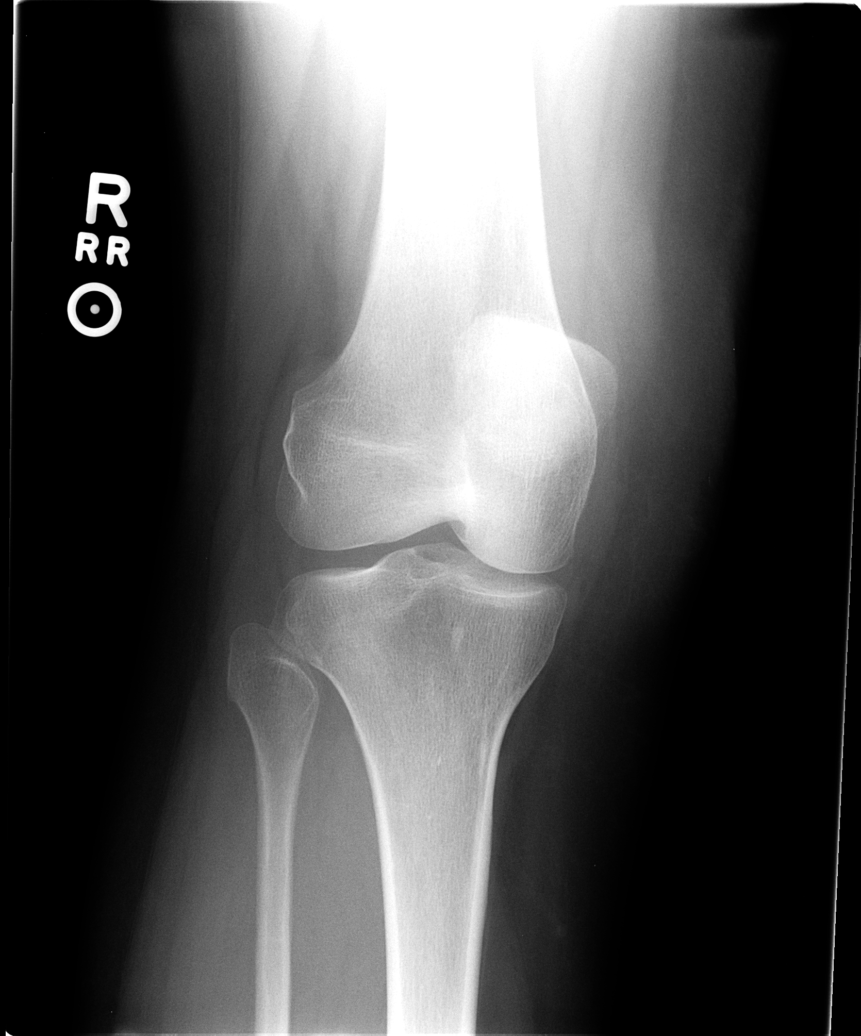

[view not recorded (4 of 5)]
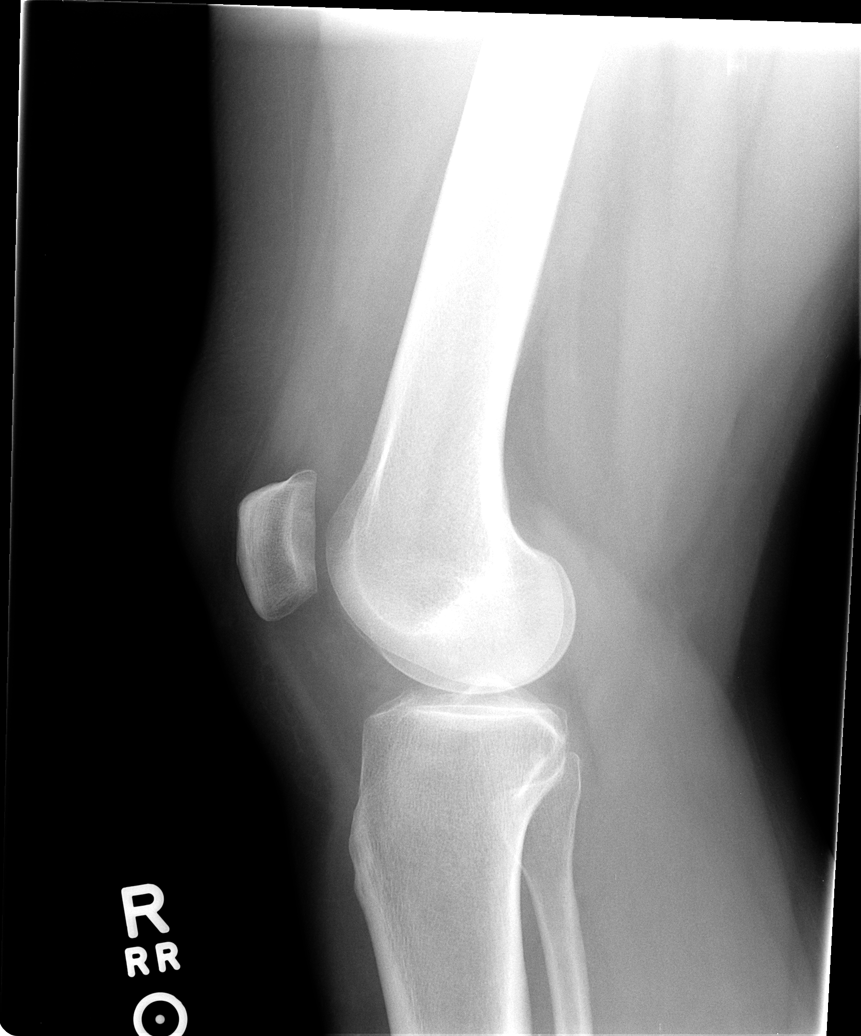

[view not recorded (5 of 5)]
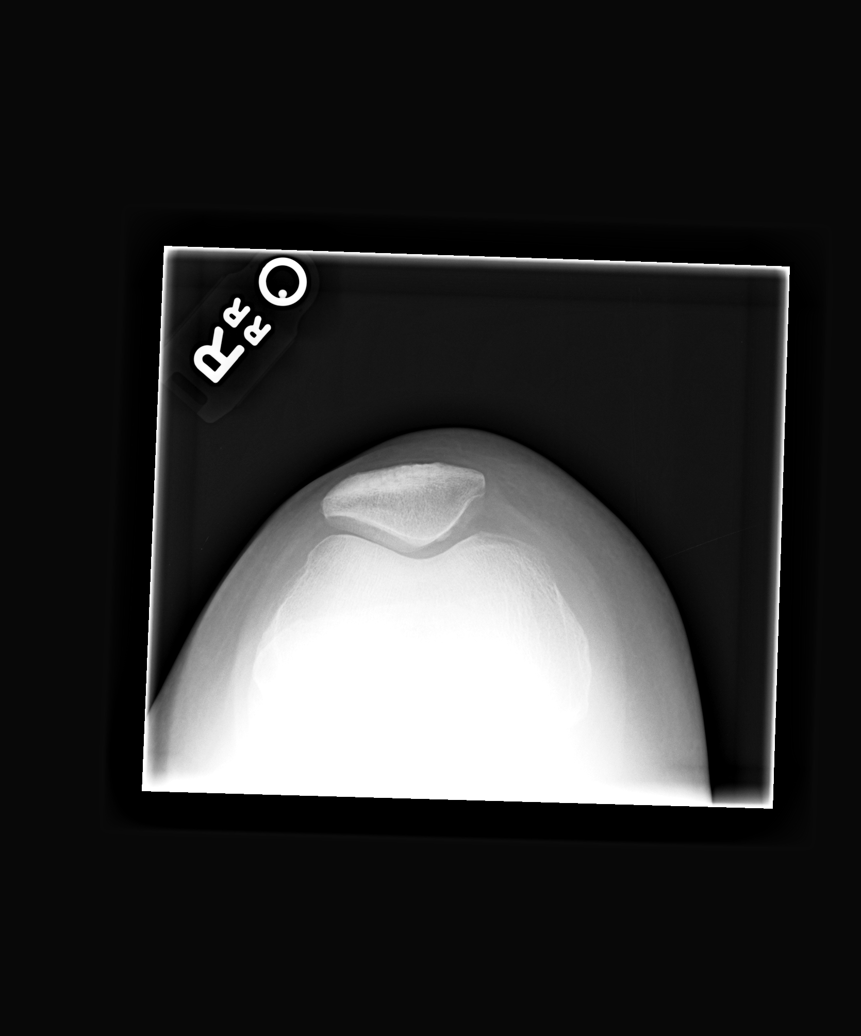

[5 of 5 positions shown; findings below may reference images not displayed]

FINDINGS: Five views of the right knee submitted. No acute fracture or
subluxation. Mild prepatellar soft tissue swelling. Small joint
effusion.
IMPRESSION: No acute fracture or subluxation. Mild prepatellar soft tissue
swelling. Small joint effusion.

## 2014-10-18 ENCOUNTER — Other Ambulatory Visit: Payer: Self-pay | Admitting: *Deleted

## 2014-10-18 DIAGNOSIS — F988 Other specified behavioral and emotional disorders with onset usually occurring in childhood and adolescence: Secondary | ICD-10-CM

## 2014-10-18 MED ORDER — AMPHETAMINE-DEXTROAMPHETAMINE 20 MG PO TABS: 20.0000 mg | ORAL_TABLET | Freq: Two times a day (BID) | ORAL | Status: DC

## 2014-11-16 ENCOUNTER — Telehealth: Payer: Self-pay | Admitting: Family Medicine

## 2014-11-16 DIAGNOSIS — F988 Other specified behavioral and emotional disorders with onset usually occurring in childhood and adolescence: Secondary | ICD-10-CM

## 2014-11-16 NOTE — Telephone Encounter (Signed)
Patient needs refill of Adderall to pick up when ready.  Thank you!

## 2014-11-17 MED ORDER — AMPHETAMINE-DEXTROAMPHETAMINE 20 MG PO TABS: 20.0000 mg | ORAL_TABLET | Freq: Two times a day (BID) | ORAL | Status: DC

## 2014-11-17 NOTE — Telephone Encounter (Signed)
Stacey Alvarado, Rx placed in in-box ready for pickup/faxing.  

## 2014-11-17 NOTE — Telephone Encounter (Signed)
Left message on vm

## 2014-12-14 ENCOUNTER — Telehealth: Payer: Self-pay | Admitting: Family Medicine

## 2014-12-14 DIAGNOSIS — F988 Other specified behavioral and emotional disorders with onset usually occurring in childhood and adolescence: Secondary | ICD-10-CM

## 2014-12-14 MED ORDER — AMPHETAMINE-DEXTROAMPHETAMINE 20 MG PO TABS: 20.0000 mg | ORAL_TABLET | Freq: Two times a day (BID) | ORAL | Status: DC

## 2014-12-14 NOTE — Telephone Encounter (Signed)
Stacey Alvarado, Rx placed in in-box ready for pickup/faxing.  

## 2014-12-14 NOTE — Telephone Encounter (Signed)
Patient needs a refill on adderall. Thanks!

## 2014-12-15 NOTE — Telephone Encounter (Signed)
Left message on vm rx up front 

## 2015-01-11 ENCOUNTER — Telehealth: Payer: Self-pay | Admitting: Family Medicine

## 2015-01-11 NOTE — Telephone Encounter (Signed)
Can you please print out rx for patient's adderall.  She would like to pick up tomorrow if possible.  Thanks so much

## 2015-01-11 NOTE — Telephone Encounter (Signed)
Unfortunately not the last Rx had instructions printed on it that a follow up appointment was needed.

## 2015-01-11 NOTE — Telephone Encounter (Signed)
Pt is due for a f/u appt. Called and left message on vm to please schedule an appt

## 2015-01-11 NOTE — Telephone Encounter (Signed)
Patient has set up appt for next Monday.  Can you please refill her adderall this week and she will keep that appt next week.  thanks

## 2015-01-17 ENCOUNTER — Encounter: Payer: Self-pay | Admitting: Family Medicine

## 2015-01-17 ENCOUNTER — Ambulatory Visit (INDEPENDENT_AMBULATORY_CARE_PROVIDER_SITE_OTHER): Payer: Managed Care, Other (non HMO) | Admitting: Family Medicine

## 2015-01-17 VITALS — BP 141/82 | HR 76 | Wt 189.0 lb

## 2015-01-17 DIAGNOSIS — F909 Attention-deficit hyperactivity disorder, unspecified type: Secondary | ICD-10-CM | POA: Diagnosis not present

## 2015-01-17 DIAGNOSIS — I10 Essential (primary) hypertension: Secondary | ICD-10-CM | POA: Diagnosis not present

## 2015-01-17 DIAGNOSIS — F329 Major depressive disorder, single episode, unspecified: Secondary | ICD-10-CM

## 2015-01-17 DIAGNOSIS — F32A Depression, unspecified: Secondary | ICD-10-CM

## 2015-01-17 DIAGNOSIS — F988 Other specified behavioral and emotional disorders with onset usually occurring in childhood and adolescence: Secondary | ICD-10-CM

## 2015-01-17 MED ORDER — AMPHETAMINE-DEXTROAMPHETAMINE 20 MG PO TABS: 20.0000 mg | ORAL_TABLET | Freq: Two times a day (BID) | ORAL | Status: DC

## 2015-01-17 MED ORDER — LISINOPRIL 10 MG PO TABS: 10.0000 mg | ORAL_TABLET | Freq: Every day | ORAL | Status: DC

## 2015-01-17 MED ORDER — ESCITALOPRAM OXALATE 10 MG PO TABS: 10.0000 mg | ORAL_TABLET | Freq: Every day | ORAL | Status: DC

## 2015-01-17 NOTE — Progress Notes (Signed)
CC: Stacey Alvarado is a 44 y.o. female is here for ADHD   Subjective: HPI:  Follow up ADD: Continues to take Adderall twice a day denies any known side effects. She believes is helping with managing her inattentiveness and distractibility at work. She tries any appetite suppression, and intentional weight loss or sleep disturbance.   Reports that she's noticed her blood pressure to be above 140 systolic to mild degree most days of the week while at work. She also has a headache later on in the day but never waking her up at night or first thing in the morning. She's been successful losing weight and also reducing salt in her diet however it does not seem to be influencing her blood pressure. She's never been on blood pressure medication. She denies chest pain shortness of breath orthopnea nor peripheral edema.  Follow-up depression: She is taking Zoloft on a daily basis but does not believe that is fully treating her depression right now. There's been no recent life-changing events but she feels"Blah" when it comes to mood. No thoughts of harm self or others. Denies any anxiety. She's been feeling like this on a daily basis for at least one month   Review Of Systems Outlined In HPI  No past medical history on file.  Past Surgical History  Procedure Laterality Date  . Cholecystectomy    . Abdominal hysterectomy    . Tonsillectomy    . Carpal tunnel release     No family history on file.  Social History   Social History  . Marital Status: Married    Spouse Name: N/A  . Number of Children: N/A  . Years of Education: N/A   Occupational History  . Not on file.   Social History Main Topics  . Smoking status: Current Every Day Smoker    Types: Cigarettes  . Smokeless tobacco: Never Used     Comment: vapor cigs  . Alcohol Use: No  . Drug Use: Not on file  . Sexual Activity: Not on file   Other Topics Concern  . Not on file   Social History Narrative     Objective: BP  141/82 mmHg  Pulse 76  Wt 189 lb (85.73 kg)  Vital signs reviewed. General: Alert and Oriented, No Acute Distress HEENT: Pupils equal, round, reactive to light. Conjunctivae clear.  External ears unremarkable.  Moist mucous membranes. Lungs: Clear and comfortable work of breathing, speaking in full sentences without accessory muscle use. Cardiac: Regular rate and rhythm.  Neuro: CN II-XII grossly intact, gait normal. Extremities: No peripheral edema.  Strong peripheral pulses.  Mental Status: Mild depression. No anxiety, nor agitation. Logical though process. Skin: Warm and dry.  Assessment & Plan: Glorine was seen today for adhd.  Diagnoses and all orders for this visit:  ADD (attention deficit disorder) -     amphetamine-dextroamphetamine (ADDERALL) 20 MG tablet; Take 1 tablet (20 mg total) by mouth 2 (two) times daily. Follow up appointment needed for future refills.  Essential hypertension  Depression  Other orders -     lisinopril (PRINIVIL,ZESTRIL) 10 MG tablet; Take 1 tablet (10 mg total) by mouth daily. -     escitalopram (LEXAPRO) 10 MG tablet; Take 1 tablet (10 mg total) by mouth daily.   ADD: Controlled with BID adderall Essential Hypertension: Begin lisinopril. Depression: Uncontrolled chronic condition.  Cut back on zoloft to  daily for five days then begin Lexapro.   Return in about 3 months (around 04/18/2015).

## 2015-01-17 NOTE — Patient Instructions (Signed)
DASH Eating Plan °DASH stands for "Dietary Approaches to Stop Hypertension." The DASH eating plan is a healthy eating plan that has been shown to reduce high blood pressure (hypertension). Additional health benefits may include reducing the risk of type 2 diabetes mellitus, heart disease, and stroke. The DASH eating plan may also help with weight loss. °WHAT DO I NEED TO KNOW ABOUT THE DASH EATING PLAN? °For the DASH eating plan, you will follow these general guidelines: °· Choose foods with a percent daily value for sodium of less than 5% (as listed on the food label). °· Use salt-free seasonings or herbs instead of table salt or sea salt. °· Check with your health care provider or pharmacist before using salt substitutes. °· Eat lower-sodium products, often labeled as "lower sodium" or "no salt added." °· Eat fresh foods. °· Eat more vegetables, fruits, and low-fat dairy products. °· Choose whole grains. Look for the word "whole" as the first word in the ingredient list. °· Choose fish and skinless chicken or turkey more often than red meat. Limit fish, poultry, and meat to 6 oz (170 g) each day. °· Limit sweets, desserts, sugars, and sugary drinks. °· Choose heart-healthy fats. °· Limit cheese to 1 oz (28 g) per day. °· Eat more home-cooked food and less restaurant, buffet, and fast food. °· Limit fried foods. °· Cook foods using methods other than frying. °· Limit canned vegetables. If you do use them, rinse them well to decrease the sodium. °· When eating at a restaurant, ask that your food be prepared with less salt, or no salt if possible. °WHAT FOODS CAN I EAT? °Seek help from a dietitian for individual calorie needs. °Grains °Whole grain or whole wheat bread. Brown rice. Whole grain or whole wheat pasta. Quinoa, bulgur, and whole grain cereals. Low-sodium cereals. Corn or whole wheat flour tortillas. Whole grain cornbread. Whole grain crackers. Low-sodium crackers. °Vegetables °Fresh or frozen vegetables  (raw, steamed, roasted, or grilled). Low-sodium or reduced-sodium tomato and vegetable juices. Low-sodium or reduced-sodium tomato sauce and paste. Low-sodium or reduced-sodium canned vegetables.  °Fruits °All fresh, canned (in natural juice), or frozen fruits. °Meat and Other Protein Products °Ground beef (85% or leaner), grass-fed beef, or beef trimmed of fat. Skinless chicken or turkey. Ground chicken or turkey. Pork trimmed of fat. All fish and seafood. Eggs. Dried beans, peas, or lentils. Unsalted nuts and seeds. Unsalted canned beans. °Dairy °Low-fat dairy products, such as skim or 1% milk, 2% or reduced-fat cheeses, low-fat ricotta or cottage cheese, or plain low-fat yogurt. Low-sodium or reduced-sodium cheeses. °Fats and Oils °Tub margarines without trans fats. Light or reduced-fat mayonnaise and salad dressings (reduced sodium). Avocado. Safflower, olive, or canola oils. Natural peanut or almond butter. °Other °Unsalted popcorn and pretzels. °The items listed above may not be a complete list of recommended foods or beverages. Contact your dietitian for more options. °WHAT FOODS ARE NOT RECOMMENDED? °Grains °White bread. White pasta. White rice. Refined cornbread. Bagels and croissants. Crackers that contain trans fat. °Vegetables °Creamed or fried vegetables. Vegetables in a cheese sauce. Regular canned vegetables. Regular canned tomato sauce and paste. Regular tomato and vegetable juices. °Fruits °Dried fruits. Canned fruit in light or heavy syrup. Fruit juice. °Meat and Other Protein Products °Fatty cuts of meat. Ribs, chicken wings, bacon, sausage, bologna, salami, chitterlings, fatback, hot dogs, bratwurst, and packaged luncheon meats. Salted nuts and seeds. Canned beans with salt. °Dairy °Whole or 2% milk, cream, half-and-half, and cream cheese. Whole-fat or sweetened yogurt. Full-fat   cheeses or blue cheese. Nondairy creamers and whipped toppings. Processed cheese, cheese spreads, or cheese  curds. °Condiments °Onion and garlic salt, seasoned salt, table salt, and sea salt. Canned and packaged gravies. Worcestershire sauce. Tartar sauce. Barbecue sauce. Teriyaki sauce. Soy sauce, including reduced sodium. Steak sauce. Fish sauce. Oyster sauce. Cocktail sauce. Horseradish. Ketchup and mustard. Meat flavorings and tenderizers. Bouillon cubes. Hot sauce. Tabasco sauce. Marinades. Taco seasonings. Relishes. °Fats and Oils °Butter, stick margarine, lard, shortening, ghee, and bacon fat. Coconut, palm kernel, or palm oils. Regular salad dressings. °Other °Pickles and olives. Salted popcorn and pretzels. °The items listed above may not be a complete list of foods and beverages to avoid. Contact your dietitian for more information. °WHERE CAN I FIND MORE INFORMATION? °National [employer], [employer], [employer]: www.nhlbi.nih.gov/health/health-topics/topics/dash/ °Document Released: 03/29/2011 Document Revised: 08/24/2013 Document Reviewed: 02/11/2013 °[employer]® Patient Information ©2015 [employer]. This information is not intended to replace advice given to you by your health care provider. Make sure you discuss any questions you have with your health care provider. ° °

## 2015-01-28 ENCOUNTER — Telehealth: Payer: Self-pay | Admitting: *Deleted

## 2015-01-28 MED ORDER — PROMETHAZINE HCL 25 MG PO TABS: 25.0000 mg | ORAL_TABLET | Freq: Three times a day (TID) | ORAL | Status: DC | PRN

## 2015-01-28 NOTE — Telephone Encounter (Signed)
Pt.notified

## 2015-01-28 NOTE — Telephone Encounter (Signed)
Rx approved

## 2015-01-28 NOTE — Telephone Encounter (Signed)
Pt called and states ever since she started the new bp med and tapered off lexapro she has been nauseous ever since. She wants to know if she can take something

## 2015-02-10 ENCOUNTER — Encounter: Payer: Self-pay | Admitting: Family Medicine

## 2015-02-10 ENCOUNTER — Ambulatory Visit (INDEPENDENT_AMBULATORY_CARE_PROVIDER_SITE_OTHER): Payer: Managed Care, Other (non HMO) | Admitting: Family Medicine

## 2015-02-10 VITALS — BP 124/77 | HR 76 | Wt 186.0 lb

## 2015-02-10 DIAGNOSIS — M2550 Pain in unspecified joint: Secondary | ICD-10-CM | POA: Diagnosis not present

## 2015-02-10 DIAGNOSIS — R5383 Other fatigue: Secondary | ICD-10-CM

## 2015-02-10 DIAGNOSIS — F909 Attention-deficit hyperactivity disorder, unspecified type: Secondary | ICD-10-CM | POA: Diagnosis not present

## 2015-02-10 DIAGNOSIS — F988 Other specified behavioral and emotional disorders with onset usually occurring in childhood and adolescence: Secondary | ICD-10-CM

## 2015-02-10 LAB — CBC
HCT: 39.5 % (ref 36.0–46.0)
HEMOGLOBIN: 13.9 g/dL (ref 12.0–15.0)
MCH: 30.8 pg (ref 26.0–34.0)
MCHC: 35.2 g/dL (ref 30.0–36.0)
MCV: 87.6 fL (ref 78.0–100.0)
MPV: 11.1 fL (ref 8.6–12.4)
Platelets: 245 10*3/uL (ref 150–400)
RBC: 4.51 MIL/uL (ref 3.87–5.11)
RDW: 13.9 % (ref 11.5–15.5)
WBC: 6.2 10*3/uL (ref 4.0–10.5)

## 2015-02-10 LAB — TSH: TSH: 1.397 u[IU]/mL (ref 0.350–4.500)

## 2015-02-10 MED ORDER — LISDEXAMFETAMINE DIMESYLATE 40 MG PO CAPS: 40.0000 mg | ORAL_CAPSULE | ORAL | Status: DC

## 2015-02-10 NOTE — Progress Notes (Addendum)
CC: Stacey Alvarado is a 44 y.o. female is here for Medication Follow Up   Subjective: HPI:  Follow-up ADD: She suspicious that she is getting a headache after every dose of Adderall. Mild severity but beginning to interfere with her quality of life. She still believes that the medicine is helping with inattentiveness and distractibility however once there is another option possibly a long-acting medication.  Complaint of long-standing joint and muscle pain that has been present for matter of years. She feels like it's been getting worse over the past few months and although her pain management specialist has tried Cymbalta daily and seems to help his narcotics. She wants to know if there something else that could be going on, serology obtained last year was negative for rheumatologic disease. She literally localizes the pain "everywhere"without any joint swelling redness or warmth. No benefit from ibuprofen or Tylenol. It has been accompanied by fatigue and hair loss. She denies fevers, chills, and intentional weight loss, or any skin changes. Denies constipation or diarrhea   Review Of Systems Outlined In HPI  No past medical history on file.  Past Surgical History  Procedure Laterality Date  . Cholecystectomy    . Abdominal hysterectomy    . Tonsillectomy    . Carpal tunnel release     No family history on file.  Social History   Social History  . Marital Status: Married    Spouse Name: N/A  . Number of Children: N/A  . Years of Education: N/A   Occupational History  . Not on file.   Social History Main Topics  . Smoking status: Current Every Day Smoker    Types: Cigarettes  . Smokeless tobacco: Never Used     Comment: vapor cigs  . Alcohol Use: No  . Drug Use: Not on file  . Sexual Activity: Not on file   Other Topics Concern  . Not on file   Social History Narrative     Objective: BP 124/77 mmHg  Pulse 76  Wt 186 lb (84.369 kg)  Vital signs  reviewed. General: Alert and Oriented, No Acute Distress HEENT: Pupils equal, round, reactive to light. Conjunctivae clear.  External ears unremarkable.  Moist mucous membranes. Lungs: Clear and comfortable work of breathing, speaking in full sentences without accessory muscle use. Cardiac: Regular rate and rhythm.  Neuro: CN II-XII grossly intact, gait normal. Extremities: No peripheral edema.  Strong peripheral pulses.  Mental Status: No depression, anxiety, nor agitation. Logical though process. Skin: Warm and dry.  Assessment & Plan: Stacey Alvarado was seen today for medication follow up.  Diagnoses and all orders for this visit:  Other fatigue -     TSH -     Vit D  25 hydroxy (rtn osteoporosis monitoring) -     CBC  Multiple joint pain -     TSH -     Vit D  25 hydroxy (rtn osteoporosis monitoring) -     CBC  ADD (attention deficit disorder)  Other orders -     lisdexamfetamine (VYVANSE) 40 MG capsule; Take 1 capsule (40 mg total) by mouth every morning.   Fatigue: Rule out vitamin D deficiency, anemia, hypothyroidism. Multiple joint pain, rule out hypothyroidism, if this is normal and vitamin D is normal will offer Lyrica. ADD: Controlled but due to side effects of Adderall switching toVyvanse  Return if symptoms worsen or fail to improve.

## 2015-02-11 ENCOUNTER — Telehealth: Payer: Self-pay | Admitting: Family Medicine

## 2015-02-11 LAB — VITAMIN D 25 HYDROXY (VIT D DEFICIENCY, FRACTURES): VIT D 25 HYDROXY: 17 ng/mL — AB (ref 30–100)

## 2015-02-11 MED ORDER — VITAMIN D (ERGOCALCIFEROL) 1.25 MG (50000 UNIT) PO CAPS: 50000.0000 [IU] | ORAL_CAPSULE | ORAL | Status: DC

## 2015-02-11 NOTE — Telephone Encounter (Signed)
Will you please let patient know that her vitamin d level was very deficient and is likely causing her fatigue and aches, I've sent a weekly supplement to her [facility], it would be wise to recheck this in three months, symptoms should gradually improve over the next month.

## 2015-02-11 NOTE — Telephone Encounter (Signed)
Pt advised.

## 2015-02-14 ENCOUNTER — Telehealth: Payer: Self-pay

## 2015-02-14 NOTE — Telephone Encounter (Signed)
I have no record of her being on Diovan, can you please ask her to clarify?

## 2015-02-14 NOTE — Telephone Encounter (Signed)
Pt called and stated that Diovan is better but does not last throughout her workday or during the evening.  I looked in pt chart and don't see where pt is taking this medication.

## 2015-02-15 NOTE — Telephone Encounter (Signed)
Pt.notified

## 2015-02-15 NOTE — Telephone Encounter (Signed)
Pt called and stated that she was talking about vyvanse and diovan.

## 2015-02-15 NOTE — Telephone Encounter (Signed)
Awaiting call back.

## 2015-02-15 NOTE — Telephone Encounter (Signed)
Ok, since it is a controlled substance I can only write a Rx every month, when she's due for a refill I can increase the dose if she still feels it's not helping her all workday long.

## 2015-03-07 ENCOUNTER — Telehealth: Payer: Self-pay

## 2015-03-07 DIAGNOSIS — F32A Depression, unspecified: Secondary | ICD-10-CM

## 2015-03-07 DIAGNOSIS — L659 Nonscarring hair loss, unspecified: Secondary | ICD-10-CM

## 2015-03-07 DIAGNOSIS — F329 Major depressive disorder, single episode, unspecified: Secondary | ICD-10-CM

## 2015-03-07 MED ORDER — DESVENLAFAXINE SUCCINATE ER 25 MG PO TB24: 25.0000 mg | ORAL_TABLET | Freq: Every day | ORAL | Status: DC

## 2015-03-07 NOTE — Telephone Encounter (Signed)
Seen during daughter's visit, not happy with vyvanse nor lexapro.  Changing lexapro, make a formal f/u visit for further eval.

## 2015-03-07 NOTE — Telephone Encounter (Signed)
Pt reports that her hair has become thin and have noticed some hair loss.  She states that she noticed this change around the time she was taking off Zoloft and put on lexapro.  Pt wants to know can any labs be drawn to determine if lexapro is causing her hair loss.

## 2015-03-07 NOTE — Telephone Encounter (Signed)
There is no lab test that I know of that would help determine if her hair loss is from lexapro.  What I can offer is an alternative to lexapro if her hair loss is intolerable.  Please let me know her preference.

## 2015-03-08 ENCOUNTER — Telehealth: Payer: Self-pay

## 2015-03-08 NOTE — Telephone Encounter (Signed)
Yes, I can't remember if she said she ever tried paroxetine or fluoxetine, these are very affordable generics, let me know if either has never been tried.

## 2015-03-08 NOTE — Telephone Encounter (Signed)
Pt reports that Pristiq is too costly and wants to know is there something cheaper?

## 2015-03-09 MED ORDER — PAROXETINE HCL 20 MG PO TABS: 20.0000 mg | ORAL_TABLET | Freq: Every day | ORAL | Status: DC

## 2015-03-09 NOTE — Telephone Encounter (Signed)
Pt.notified

## 2015-03-09 NOTE — Telephone Encounter (Signed)
Pt has taken fluoxetine.

## 2015-03-09 NOTE — Telephone Encounter (Signed)
Rx of paroxetine sent to her [facility] to replace pristiq

## 2015-03-11 ENCOUNTER — Ambulatory Visit (INDEPENDENT_AMBULATORY_CARE_PROVIDER_SITE_OTHER): Payer: Managed Care, Other (non HMO) | Admitting: Family Medicine

## 2015-03-11 ENCOUNTER — Encounter: Payer: Self-pay | Admitting: Family Medicine

## 2015-03-11 VITALS — BP 125/79 | HR 79 | Wt 188.0 lb

## 2015-03-11 DIAGNOSIS — F909 Attention-deficit hyperactivity disorder, unspecified type: Secondary | ICD-10-CM | POA: Diagnosis not present

## 2015-03-11 DIAGNOSIS — F988 Other specified behavioral and emotional disorders with onset usually occurring in childhood and adolescence: Secondary | ICD-10-CM

## 2015-03-11 MED ORDER — METHYLPHENIDATE HCL ER 36 MG PO TB24: 36.0000 mg | ORAL_TABLET | Freq: Every day | ORAL | Status: DC

## 2015-03-11 NOTE — Progress Notes (Signed)
CC: Stacey Alvarado is a 44 y.o. female is here for Medication Refill   Subjective: HPI:  FU ADD: Adderall was giving her headaches, Vyvansse does not provide any help with distract ability and inattentiveness. States she doesn't feel like she's even taking anything.  Symptoms are moderate in severity and daily.  Denies anxiety or depression.  No sleeping problems.     Review Of Systems Outlined In HPI  No past medical history on file.  Past Surgical History  Procedure Laterality Date  . Cholecystectomy    . Abdominal hysterectomy    . Tonsillectomy    . Carpal tunnel release     No family history on file.  Social History   Social History  . Marital Status: Married    Spouse Name: N/A  . Number of Children: N/A  . Years of Education: N/A   Occupational History  . Not on file.   Social History Main Topics  . Smoking status: Current Every Day Smoker    Types: Cigarettes  . Smokeless tobacco: Never Used     Comment: vapor cigs  . Alcohol Use: No  . Drug Use: Not on file  . Sexual Activity: Not on file   Other Topics Concern  . Not on file   Social History Narrative     Objective: BP 125/79 mmHg  Pulse 79  Wt 188 lb (85.276 kg)  General: Alert and Oriented, No Acute Distress HEENT: Pupils equal, round, reactive to light. Conjunctivae clear.  Moist mucous membranes Lungs: Clear to auscultation bilaterally, no wheezing/ronchi/rales.  Comfortable work of breathing. Good air movement. Cardiac: Regular rate and rhythm. Normal S1/S2.  No murmurs, rubs, nor gallops.   Extremities: No peripheral edema.  Strong peripheral pulses.  Mental Status: No depression, anxiety, nor agitation. Skin: Warm and dry.  Assessment & Plan: Stacey Alvarado was seen today for medication refill.  Diagnoses and all orders for this visit:  ADD (attention deficit disorder) -     methylphenidate 36 MG PO CR tablet; Take 1 tablet (36 mg total) by mouth daily.  Other orders -     Cancel:  lisdexamfetamine (VYVANSE) 40 MG capsule; Take 1 capsule (40 mg total) by mouth every morning.   ADD uncontrolled, switching to concerta, 7 days. If effective I've asked her to call me for a full 30 day fill.  Return in about 3 months (around 06/11/2015).

## 2015-03-14 ENCOUNTER — Telehealth: Payer: Self-pay | Admitting: Family Medicine

## 2015-03-14 MED ORDER — AMPHETAMINE-DEXTROAMPHETAMINE 15 MG PO TABS: 22.5000 mg | ORAL_TABLET | Freq: Every day | ORAL | Status: DC

## 2015-03-14 NOTE — Telephone Encounter (Signed)
[place], Rx placed in in-box ready for pickup/faxing.  

## 2015-03-14 NOTE — Telephone Encounter (Signed)
Pt advised.

## 2015-03-14 NOTE — Telephone Encounter (Signed)
Please advise 

## 2015-03-14 NOTE — Telephone Encounter (Signed)
Patient states that the concerta is not really helping.  She would like to go back on adderall.  She wondered if you could go up a little on the dosage.  She would like to pick up the script tomorrow.  thanks

## 2015-03-24 ENCOUNTER — Telehealth: Payer: Self-pay

## 2015-03-24 NOTE — Telephone Encounter (Signed)
Pt had questions about her adderall.  She is currently on 15 mg take 1 1/2 tab qd.  She understands that you upped her dose but wants to know why she isn't taking 1 1/2 tab BID.

## 2015-03-24 NOTE — Telephone Encounter (Signed)
Oh this is an error on my part.  It should be twice a day but I accidentally clicked on once a day.  She should have enough to last a full month if being taken twice a day. Sorry.

## 2015-03-24 NOTE — Telephone Encounter (Signed)
Pt notified.  She reported that the pharmacy only gave her 45 tablets.  Pharmacy contacted, her insurance will only cover a months supply at a time.  Pt will be 15 tablets short because of how the script was written.

## 2015-03-25 NOTE — Telephone Encounter (Signed)
Ok just call when refill needed.

## 2015-03-25 NOTE — Telephone Encounter (Signed)
Pt.notified

## 2015-03-31 ENCOUNTER — Telehealth: Payer: Self-pay | Admitting: Family Medicine

## 2015-03-31 NOTE — Telephone Encounter (Signed)
Patient advised that she needs to be taking 1 and a half adderall twice daily, per dr Ivan Anchorshommel

## 2015-04-04 ENCOUNTER — Telehealth: Payer: Self-pay | Admitting: Family Medicine

## 2015-04-04 ENCOUNTER — Other Ambulatory Visit: Payer: Self-pay

## 2015-04-04 MED ORDER — AMPHETAMINE-DEXTROAMPHETAMINE 15 MG PO TABS: 22.5000 mg | ORAL_TABLET | Freq: Two times a day (BID) | ORAL | Status: DC

## 2015-04-04 NOTE — Telephone Encounter (Signed)
[place], Rx placed in in-box ready for pickup/faxing.  

## 2015-04-04 NOTE — Telephone Encounter (Signed)
Refill request

## 2015-04-04 NOTE — Telephone Encounter (Signed)
Pt.notified

## 2015-04-04 NOTE — Telephone Encounter (Signed)
Patient is needing a refill for her adderall. thanks

## 2015-04-14 ENCOUNTER — Telehealth: Payer: Self-pay

## 2015-04-14 MED ORDER — VENLAFAXINE HCL ER 75 MG PO CP24: ORAL_CAPSULE | ORAL | Status: DC

## 2015-04-14 NOTE — Telephone Encounter (Signed)
Pt.notified

## 2015-04-14 NOTE — Telephone Encounter (Signed)
Rx sent to [facility]

## 2015-04-14 NOTE — Telephone Encounter (Signed)
Pt reports that paxil is not working for her and would like to be on effexor.

## 2015-05-02 ENCOUNTER — Telehealth: Payer: Self-pay | Admitting: Family Medicine

## 2015-05-02 ENCOUNTER — Other Ambulatory Visit: Payer: Self-pay

## 2015-05-02 MED ORDER — AMPHETAMINE-DEXTROAMPHETAMINE 15 MG PO TABS: 22.5000 mg | ORAL_TABLET | Freq: Two times a day (BID) | ORAL | Status: DC

## 2015-05-02 NOTE — Telephone Encounter (Signed)
Patient needs refill on adderall.  thanks

## 2015-05-02 NOTE — Telephone Encounter (Signed)
Rx ready for pick up. 

## 2015-05-25 ENCOUNTER — Ambulatory Visit: Payer: Managed Care, Other (non HMO) | Admitting: Family Medicine

## 2015-05-30 ENCOUNTER — Telehealth: Payer: Self-pay | Admitting: Family Medicine

## 2015-05-30 MED ORDER — AMPHETAMINE-DEXTROAMPHETAMINE 15 MG PO TABS: 22.5000 mg | ORAL_TABLET | Freq: Two times a day (BID) | ORAL | Status: DC

## 2015-05-30 NOTE — Telephone Encounter (Signed)
Patient needs refill on adderall.  thanks

## 2015-05-30 NOTE — Telephone Encounter (Signed)
Stacey Alvarado, Rx placed in in-box ready for pickup/faxing.  

## 2015-05-30 NOTE — Telephone Encounter (Signed)
Pt.notified

## 2015-06-06 ENCOUNTER — Telehealth: Payer: Self-pay

## 2015-06-06 MED ORDER — VENLAFAXINE HCL ER 75 MG PO CP24: ORAL_CAPSULE | ORAL | Status: DC

## 2015-06-06 NOTE — Telephone Encounter (Signed)
Rx sent to pharmacy   

## 2015-06-06 NOTE — Telephone Encounter (Signed)
Pt stated that she is doing better with Effexor. She said that she may have told you that she wanted to stop it but now she wants to stay on it.  Is it for her to continue this medication?

## 2015-06-06 NOTE — Telephone Encounter (Signed)
Yes this is ok, i'll send refills to her [facility]

## 2015-06-27 ENCOUNTER — Telehealth: Payer: Self-pay | Admitting: Family Medicine

## 2015-06-27 MED ORDER — AMPHETAMINE-DEXTROAMPHETAMINE 15 MG PO TABS: 22.5000 mg | ORAL_TABLET | Freq: Two times a day (BID) | ORAL | Status: DC

## 2015-06-27 NOTE — Telephone Encounter (Signed)
Pt.notified

## 2015-06-27 NOTE — Telephone Encounter (Signed)
Patient needs refill on her adderall.  She would like to pick up today if possible.  Thank you!  Have a great Monday!

## 2015-06-27 NOTE — Telephone Encounter (Signed)
[place], Rx placed in in-box ready for pickup/faxing. Please transfer up front to schedule f/u appointment.

## 2015-06-30 ENCOUNTER — Telehealth: Payer: Self-pay | Admitting: *Deleted

## 2015-06-30 NOTE — Telephone Encounter (Signed)
PA was initiated for venlafaxine and apptoved form 06-29-2015 from 09-29-2015.  spoke with patient and pharm

## 2015-07-06 ENCOUNTER — Ambulatory Visit (INDEPENDENT_AMBULATORY_CARE_PROVIDER_SITE_OTHER): Payer: Managed Care, Other (non HMO) | Admitting: Family Medicine

## 2015-07-06 ENCOUNTER — Encounter: Payer: Self-pay | Admitting: Family Medicine

## 2015-07-06 VITALS — BP 126/85 | HR 92 | Wt 196.0 lb

## 2015-07-06 DIAGNOSIS — F909 Attention-deficit hyperactivity disorder, unspecified type: Secondary | ICD-10-CM

## 2015-07-06 DIAGNOSIS — F32A Depression, unspecified: Secondary | ICD-10-CM

## 2015-07-06 DIAGNOSIS — I10 Essential (primary) hypertension: Secondary | ICD-10-CM

## 2015-07-06 DIAGNOSIS — F988 Other specified behavioral and emotional disorders with onset usually occurring in childhood and adolescence: Secondary | ICD-10-CM

## 2015-07-06 DIAGNOSIS — F329 Major depressive disorder, single episode, unspecified: Secondary | ICD-10-CM

## 2015-07-06 MED ORDER — VENLAFAXINE HCL ER 75 MG PO CP24: ORAL_CAPSULE | ORAL | Status: DC

## 2015-07-06 MED ORDER — AMPHETAMINE-DEXTROAMPHETAMINE 15 MG PO TABS: 22.5000 mg | ORAL_TABLET | Freq: Two times a day (BID) | ORAL | Status: DC

## 2015-07-06 MED ORDER — ARIPIPRAZOLE 5 MG PO TABS: 5.0000 mg | ORAL_TABLET | Freq: Every day | ORAL | Status: DC

## 2015-07-06 NOTE — Progress Notes (Signed)
CC: Stacey Alvarado is a 45 y.o. female is here for ADD   Subjective: HPI:  Despite taking Effexor XR daily  she has lost interest in hobbies,   has crying spells for no reason, and just feels subjectively depressed. She denies any known side effects. She wants something she can take in conjunction with this to help with depression. She denies any anxiety.  Follow-up ADHD: She is taking Adderall as prescribed with 100% compliance without any known side effects. She still believes is helping greatly at work and at home with avoiding distractibility.  Follow-up essential hypertension: Currently not taking any antihypertensives. No outside blood pressures report   Review Of Systems Outlined In HPI  No past medical history on file.  Past Surgical History  Procedure Laterality Date  . Cholecystectomy    . Abdominal hysterectomy    . Tonsillectomy    . Carpal tunnel release     No family history on file.  Social History   Social History  . Marital Status: Married    Spouse Name: N/A  . Number of Children: N/A  . Years of Education: N/A   Occupational History  . Not on file.   Social History Main Topics  . Smoking status: Current Every Day Smoker    Types: Cigarettes  . Smokeless tobacco: Never Used     Comment: vapor cigs  . Alcohol Use: No  . Drug Use: Not on file  . Sexual Activity: Not on file   Other Topics Concern  . Not on file   Social History Narrative     Objective: BP 126/85 mmHg  Pulse 92  Wt 196 lb (88.905 kg)  Vital signs reviewed. General: Alert and Oriented, No Acute Distress HEENT: Pupils equal, round, reactive to light. Conjunctivae clear.  External ears unremarkable.  Moist mucous membranes. Lungs: Clear and comfortable work of breathing, speaking in full sentences without accessory muscle use. Cardiac: Regular rate and rhythm.  Neuro: CN II-XII grossly intact, gait normal. Extremities: No peripheral edema.  Strong peripheral pulses.  Mental  Status: Mild depression with brief crying episode. No anxiety, nor agitation. Logical though process. Skin: Warm and dry. Assessment & Plan: Stacey Alvarado was seen today for add.  Diagnoses and all orders for this visit:  ADD (attention deficit disorder) -     amphetamine-dextroamphetamine (ADDERALL) 15 MG tablet; Take 1.5 tablets by mouth 2 (two) times daily.  Depression -     ARIPiprazole (ABILIFY) 5 MG tablet; Take 1 tablet (5 mg total) by mouth daily. -     venlafaxine XR (EFFEXOR XR) 75 MG 24 hr capsule; One by mouth daily, may increase to two every morning if ineffective after two weeks.  Essential hypertension  ADHD: Controlled with Adderall   depression: Controlled chronic condition continue Effexor adding Abilify Essential hypertension: Controlled with diet interventions.    Return in about 3 months (around 10/06/2015) for mood.

## 2015-07-12 ENCOUNTER — Telehealth: Payer: Self-pay | Admitting: Family Medicine

## 2015-07-12 DIAGNOSIS — R41840 Attention and concentration deficit: Secondary | ICD-10-CM

## 2015-07-12 NOTE — Telephone Encounter (Signed)
Detailed vm left for pt.  Pt advised to call with any additional questions

## 2015-07-12 NOTE — Telephone Encounter (Signed)
I'm running out of ideas since vyvanse and concerta weren't helping either.  I'd recommend she visit with a physician called Dr. Marisue BrooklynAmy Stevenson who specializes in ADD who will help with testing to see if I'm missing some additional mental disability which could be causing these medicines to be so ineffective. Please let me know if not contacted about scheduling by next week.

## 2015-07-12 NOTE — Telephone Encounter (Signed)
Patient states that she feels like the adderall is not working for her anymore.  She was wondering about you either increasing her dose or maybe prescribing something else.   She said that she forgot to mention it at her appt last week.  Please advise.  Thanks!

## 2015-08-10 ENCOUNTER — Telehealth: Payer: Self-pay | Admitting: Family Medicine

## 2015-08-10 MED ORDER — BUPROPION HCL ER (XL) 150 MG PO TB24: 150.0000 mg | ORAL_TABLET | Freq: Every day | ORAL | Status: DC

## 2015-08-10 NOTE — Telephone Encounter (Signed)
Patient is requesting to see if you would be willing for her to try wellbutrin with the effexor....instead of the abilify???  She has gained 5 pounds and doesn't feel like it is working.  Please advise.  thanks

## 2015-08-10 NOTE — Telephone Encounter (Signed)
Yeah I'm ok with this, Rx sent to [facility]

## 2015-08-11 NOTE — Telephone Encounter (Signed)
Pt.notified

## 2015-08-16 ENCOUNTER — Telehealth: Payer: Self-pay | Admitting: Family Medicine

## 2015-08-16 DIAGNOSIS — F988 Other specified behavioral and emotional disorders with onset usually occurring in childhood and adolescence: Secondary | ICD-10-CM

## 2015-08-16 MED ORDER — AMPHETAMINE-DEXTROAMPHETAMINE 15 MG PO TABS: 22.5000 mg | ORAL_TABLET | Freq: Two times a day (BID) | ORAL | Status: DC

## 2015-08-16 NOTE — Addendum Note (Signed)
Addended by: Laren BoomHOMMEL, Rosaleigh Brazzel on: 08/16/2015 01:45 PM   Modules accepted: Orders

## 2015-08-16 NOTE — Telephone Encounter (Signed)
Patient needs a refill on her adderall please and thank yiou

## 2015-08-16 NOTE — Telephone Encounter (Signed)
[place], Rx placed in in-box ready for pickup/faxing.  

## 2015-08-16 NOTE — Telephone Encounter (Signed)
Pt.notified

## 2015-09-13 ENCOUNTER — Encounter: Payer: Self-pay | Admitting: Family Medicine

## 2015-09-13 ENCOUNTER — Ambulatory Visit (INDEPENDENT_AMBULATORY_CARE_PROVIDER_SITE_OTHER): Payer: Managed Care, Other (non HMO) | Admitting: Family Medicine

## 2015-09-13 VITALS — BP 134/84 | HR 94 | Ht 68.0 in | Wt 198.0 lb

## 2015-09-13 DIAGNOSIS — F988 Other specified behavioral and emotional disorders with onset usually occurring in childhood and adolescence: Secondary | ICD-10-CM

## 2015-09-13 DIAGNOSIS — F329 Major depressive disorder, single episode, unspecified: Secondary | ICD-10-CM

## 2015-09-13 DIAGNOSIS — F32A Depression, unspecified: Secondary | ICD-10-CM

## 2015-09-13 DIAGNOSIS — F909 Attention-deficit hyperactivity disorder, unspecified type: Secondary | ICD-10-CM

## 2015-09-13 MED ORDER — NORGESTIMATE-ETH ESTRADIOL 0.25-35 MG-MCG PO TABS: 1.0000 | ORAL_TABLET | Freq: Every day | ORAL | Status: DC

## 2015-09-13 MED ORDER — AMPHETAMINE-DEXTROAMPHETAMINE 15 MG PO TABS: 22.5000 mg | ORAL_TABLET | Freq: Two times a day (BID) | ORAL | Status: DC

## 2015-09-13 NOTE — Progress Notes (Signed)
CC: Stacey Alvarado is a 45 y.o. female is here for Follow-up   Subjective: HPI:  Follow depression: She feels like Wellbutrin did not provide her any benefit whatsoever with respect to her depression. She still feels irritable towards others and can't seem to find anything that makes her happy in life. No thoughts of wanting to harm self or others. She denies any anxiety. She tells that she's felt like this ever since she had a hysterectomy where her ovaries were removed as well. She was on estrogen and progesterone for 1 month however her gynecologist refused to provide refills when he found out she was a smoker. She's tried multiple different antidepression medications with different mechanisms of action and nothing seems to help. Review of systems positive for unintentional weight gain. Denies chest pain shortness of breath or edema  Follow ADD: She is requesting a refill on Adderall. She inquired in the past whether or not the dose could be changed, she assumed that the dose is too low because she was still having some distractibility at work. She never got contacted for the referral was placed 2 months ago. She still interested in pursuing this   Review Of Systems Outlined In HPI  No past medical history on file.  Past Surgical History  Procedure Laterality Date  . Cholecystectomy    . Abdominal hysterectomy    . Tonsillectomy    . Carpal tunnel release     No family history on file.  Social History   Social History  . Marital Status: Married    Spouse Name: N/A  . Number of Children: N/A  . Years of Education: N/A   Occupational History  . Not on file.   Social History Main Topics  . Smoking status: Current Every Day Smoker    Types: Cigarettes  . Smokeless tobacco: Never Used     Comment: vapor cigs  . Alcohol Use: No  . Drug Use: Not on file  . Sexual Activity: Not on file   Other Topics Concern  . Not on file   Social History Narrative     Objective: BP  134/84 mmHg  Pulse 94  Ht 5\' 8"  (1.727 m)  Wt 198 lb (89.812 kg)  BMI 30.11 kg/m2  Vital signs reviewed. General: Alert and Oriented, No Acute Distress HEENT: Pupils equal, round, reactive to light. Conjunctivae clear.  External ears unremarkable.  Moist mucous membranes. Lungs: Clear and comfortable work of breathing, speaking in full sentences without accessory muscle use. Cardiac: Regular rate and rhythm.  Neuro: CN II-XII grossly intact, gait normal. Extremities: No peripheral edema.  Strong peripheral pulses.  Mental Status: Mild depression with occasional tearfulness. No anxiety, nor agitation. Logical though process. Skin: Warm and dry.   Assessment & Plan: Stacey Alvarado was seen today for follow-up.  Diagnoses and all orders for this visit:  Depression -     TSH -     norgestimate-ethinyl estradiol (SPRINTEC 28) 0.25-35 MG-MCG tablet; Take 1 tablet by mouth daily.  ADD (attention deficit disorder) -     amphetamine-dextroamphetamine (ADDERALL) 15 MG tablet; Take 1.5 tablets by mouth 2 (two) times daily.   Depression: Uncontrolled chronic condition, continuing with Effexor, starting Sprintec with her understanding that this increases the risk of DVTs given that she is a smoker. We are going to test a theory that her depression was induced by surgical menopause. ADD: Marginally controlled, refilling Adderall resubmitting referral to [place] [facility].  Return in about 4 weeks (around 10/11/2015).

## 2015-10-10 ENCOUNTER — Telehealth: Payer: Self-pay | Admitting: Family Medicine

## 2015-10-10 DIAGNOSIS — F988 Other specified behavioral and emotional disorders with onset usually occurring in childhood and adolescence: Secondary | ICD-10-CM

## 2015-10-10 MED ORDER — AMPHETAMINE-DEXTROAMPHETAMINE 15 MG PO TABS: 22.5000 mg | ORAL_TABLET | Freq: Two times a day (BID) | ORAL | Status: DC

## 2015-10-10 NOTE — Telephone Encounter (Signed)
Pt.notified

## 2015-10-10 NOTE — Telephone Encounter (Signed)
Patient needs refill on adderall please.  thanks

## 2015-10-10 NOTE — Telephone Encounter (Signed)
[place], Rx placed in in-box ready for pickup/faxing.  

## 2015-10-11 ENCOUNTER — Ambulatory Visit: Payer: Managed Care, Other (non HMO) | Admitting: Family Medicine

## 2015-10-21 ENCOUNTER — Ambulatory Visit: Payer: Managed Care, Other (non HMO) | Admitting: Family Medicine

## 2015-10-31 ENCOUNTER — Telehealth: Payer: Self-pay

## 2015-10-31 NOTE — Telephone Encounter (Signed)
Pt wants to stop taking Effexor and go back on Zoloft. Please advise

## 2015-11-01 MED ORDER — SERTRALINE HCL 100 MG PO TABS: ORAL_TABLET | ORAL | Status: DC

## 2015-11-01 MED ORDER — VENLAFAXINE HCL ER 37.5 MG PO CP24: ORAL_CAPSULE | ORAL | Status: DC

## 2015-11-01 NOTE — Telephone Encounter (Signed)
Recommendations left on vm 

## 2015-11-01 NOTE — Telephone Encounter (Signed)
A taper down regimen of effexor and taper up regimen of zoloft has been sent to her pharmacy. Please f/u in one month.

## 2015-11-07 ENCOUNTER — Other Ambulatory Visit: Payer: Self-pay

## 2015-11-07 DIAGNOSIS — F988 Other specified behavioral and emotional disorders with onset usually occurring in childhood and adolescence: Secondary | ICD-10-CM

## 2015-11-07 MED ORDER — AMPHETAMINE-DEXTROAMPHETAMINE 15 MG PO TABS: 22.5000 mg | ORAL_TABLET | Freq: Two times a day (BID) | ORAL | Status: DC

## 2015-12-13 ENCOUNTER — Ambulatory Visit (INDEPENDENT_AMBULATORY_CARE_PROVIDER_SITE_OTHER): Payer: Managed Care, Other (non HMO) | Admitting: Family Medicine

## 2015-12-13 ENCOUNTER — Encounter: Payer: Self-pay | Admitting: Family Medicine

## 2015-12-13 VITALS — BP 130/86 | HR 88 | Wt 201.0 lb

## 2015-12-13 DIAGNOSIS — F32A Depression, unspecified: Secondary | ICD-10-CM

## 2015-12-13 DIAGNOSIS — F909 Attention-deficit hyperactivity disorder, unspecified type: Secondary | ICD-10-CM

## 2015-12-13 DIAGNOSIS — F988 Other specified behavioral and emotional disorders with onset usually occurring in childhood and adolescence: Secondary | ICD-10-CM

## 2015-12-13 DIAGNOSIS — N951 Menopausal and female climacteric states: Secondary | ICD-10-CM | POA: Diagnosis not present

## 2015-12-13 DIAGNOSIS — F329 Major depressive disorder, single episode, unspecified: Secondary | ICD-10-CM | POA: Diagnosis not present

## 2015-12-13 DIAGNOSIS — R232 Flushing: Secondary | ICD-10-CM

## 2015-12-13 MED ORDER — AMPHETAMINE-DEXTROAMPHETAMINE 15 MG PO TABS: 22.5000 mg | ORAL_TABLET | Freq: Two times a day (BID) | ORAL | 0 refills | Status: DC

## 2015-12-13 MED ORDER — NORELGESTROMIN-ETH ESTRADIOL 150-35 MCG/24HR TD PTWK: 1.0000 | MEDICATED_PATCH | TRANSDERMAL | 12 refills | Status: DC

## 2015-12-13 MED ORDER — SERTRALINE HCL 100 MG PO TABS: ORAL_TABLET | ORAL | 1 refills | Status: DC

## 2015-12-13 NOTE — Progress Notes (Signed)
CC: Stacey Alvarado is a 45 y.o. female is here for Medication Refill   Subjective: HPI:  Follow-up depression: She had no trouble transitioning from Effexor to Zoloft. She feels that Zoloft is much more effective than Effexor. She is no longer as irritable towards others and denies any subjective depression. She denies any anxiety or any other mental disturbance.  She would like a refill Adderall. She is currently helping her with inconvenient distractibility at work. She denies any known side effects. She denies any sleep disturbance, anxiety or reduced appetite.  She tells me that Sprintec was causing her headaches. She wants to know if she can try a transdermal form of hormones to help reduce the incidence of hot flashes.   Review Of Systems Outlined In HPI  No past medical history on file.  Past Surgical History:  Procedure Laterality Date  . ABDOMINAL HYSTERECTOMY    . CARPAL TUNNEL RELEASE    . CHOLECYSTECTOMY    . TONSILLECTOMY     No family history on file.  Social History   Social History  . Marital status: Married    Spouse name: N/A  . Number of children: N/A  . Years of education: N/A   Occupational History  . Not on file.   Social History Main Topics  . Smoking status: Current Every Day Smoker    Types: Cigarettes  . Smokeless tobacco: Never Used     Comment: vapor cigs  . Alcohol use No  . Drug use: Unknown  . Sexual activity: Not on file   Other Topics Concern  . Not on file   Social History Narrative  . No narrative on file     Objective: BP 130/86   Pulse 88   Wt 201 lb (91.2 kg)   BMI 30.56 kg/m   Vital signs reviewed. General: Alert and Oriented, No Acute Distress HEENT: Pupils equal, round, reactive to light. Conjunctivae clear.  External ears unremarkable.  Moist mucous membranes. Lungs: Clear and comfortable work of breathing, speaking in full sentences without accessory muscle use. Cardiac: Regular rate and rhythm.  Neuro: CN  II-XII grossly intact, gait normal. Extremities: No peripheral edema.  Strong peripheral pulses.  Mental Status: No depression, anxiety, nor agitation. Logical though process. Skin: Warm and dry.  Assessment & Plan: Stacey Alvarado was seen today for medication refill.  Diagnoses and all orders for this visit:  Depression  ADD (attention deficit disorder) -     amphetamine-dextroamphetamine (ADDERALL) 15 MG tablet; Take 1.5 tablets by mouth 2 (two) times daily.  Hot flashes  Other orders -     sertraline (ZOLOFT) 100 MG tablet; One by mouth daily. -     norelgestromin-ethinyl estradiol (ORTHO EVRA) 150-35 MCG/24HR transdermal patch; Place 1 patch onto the skin once a week.  Depression: Controlled on Zoloft ADD: Controlled with Adderall Hot flashes: Her request for transdermal hormones seems reasonable, prepared her that this may also cause headaches due to estrogen the estrogen component.  Discussed with this patient that I will be resigning from my position here with [facility] in September in order to stay with my [place]. I let him know about the providers that are still accepting patients and I feel that this individual will be under great care if he/she stays here with [facility].   Return in about 3 months (around 03/14/2016) for ADD F/U.

## 2016-01-13 ENCOUNTER — Other Ambulatory Visit: Payer: Self-pay

## 2016-01-13 DIAGNOSIS — F988 Other specified behavioral and emotional disorders with onset usually occurring in childhood and adolescence: Secondary | ICD-10-CM

## 2016-01-13 MED ORDER — AMPHETAMINE-DEXTROAMPHETAMINE 15 MG PO TABS: 22.5000 mg | ORAL_TABLET | Freq: Two times a day (BID) | ORAL | 0 refills | Status: DC

## 2016-01-13 NOTE — Telephone Encounter (Signed)
Left message advising patient to call and schedule an appointment with a provider to establish care. A refill of Adderall will be up front. She will need to establish care before next refill.

## 2016-02-14 ENCOUNTER — Ambulatory Visit (INDEPENDENT_AMBULATORY_CARE_PROVIDER_SITE_OTHER): Payer: Managed Care, Other (non HMO) | Admitting: Physician Assistant

## 2016-02-14 ENCOUNTER — Encounter: Payer: Self-pay | Admitting: Physician Assistant

## 2016-02-14 VITALS — BP 121/70 | HR 85 | Ht 68.0 in | Wt 201.0 lb

## 2016-02-14 DIAGNOSIS — F988 Other specified behavioral and emotional disorders with onset usually occurring in childhood and adolescence: Secondary | ICD-10-CM | POA: Diagnosis not present

## 2016-02-14 DIAGNOSIS — E6609 Other obesity due to excess calories: Secondary | ICD-10-CM

## 2016-02-14 DIAGNOSIS — F330 Major depressive disorder, recurrent, mild: Secondary | ICD-10-CM

## 2016-02-14 MED ORDER — LORCASERIN HCL 10 MG PO TABS: 1.0000 | ORAL_TABLET | Freq: Two times a day (BID) | ORAL | 2 refills | Status: DC

## 2016-02-14 MED ORDER — AMPHETAMINE-DEXTROAMPHETAMINE 15 MG PO TABS: 22.5000 mg | ORAL_TABLET | Freq: Two times a day (BID) | ORAL | 0 refills | Status: DC

## 2016-02-14 NOTE — Progress Notes (Signed)
   Subjective:    Patient ID: Stacey Alvarado, female    DOB: 01/10/1971, 45 y.o.   MRN: [id number]  HPI  Pt is a 45 yo female who presents to the clinic for follow up.   ADD- doing well on dose. No anorexia, palpitations, increase in anxiety, CP, dizziness.   MDD- needs refill. She still has some problems with depression but does not want to make any changes. She denies any sucidal or homicidal thoughts.   She is struggling with weight. She has gained 13lbs in one year. She would like help with this. Admits to not exercising.   Review of Systems See HPI.     Objective:   Physical Exam  Constitutional: She is oriented to person, place, and time. She appears well-developed and well-nourished.  HENT:  Head: Normocephalic and atraumatic.  Cardiovascular: Normal rate, regular rhythm and normal heart sounds.   Pulmonary/Chest: Effort normal and breath sounds normal.  Neurological: She is alert and oriented to person, place, and time.  Psychiatric: She has a normal mood and affect. Her behavior is normal.          Assessment & Plan:  Marland Kitchen.Marland Kitchen.Stacey Alvarado was seen today for add.  Diagnoses and all orders for this visit:  Attention deficit disorder (ADD) without hyperactivity -     Discontinue: amphetamine-dextroamphetamine (ADDERALL) 15 MG tablet; Take 1.5 tablets by mouth 2 (two) times daily. -     amphetamine-dextroamphetamine (ADDERALL) 15 MG tablet; Take 1.5 tablets by mouth 2 (two) times daily. Do not refill until 03/14/16. -     Discontinue: amphetamine-dextroamphetamine (ADDERALL) 15 MG tablet; Take 1.5 tablets by mouth 2 (two) times daily. Do not refill until 04/11/16.  Mild episode of recurrent major depressive disorder (HCC)  Class 1 obesity due to excess calories without serious comorbidity in adult, unspecified BMI  Other orders -     Lorcaserin HCl 10 MG TABS; Take 1 tablet by mouth 2 (two) times daily.   ADD- refilled for 3 months. Vitals stable.   MDD- continue on  zoloft. Refill given PHQ-9 was 9. GAD-7 is 10.   Obesity- pt is not a candidate for phentermine due to being on a stimulant. Discussed weight loss options. Started belviq bid. Discussed side effects. Reviewed weight loss and exercise plan. Follow up in 3 months. Coupon card given.

## 2016-02-16 ENCOUNTER — Telehealth: Payer: Self-pay | Admitting: *Deleted

## 2016-02-16 NOTE — Telephone Encounter (Signed)
Pt left vm stating that the Belviq needed a PA.  Since we never received a request from the pharmacy, I called to find out.  Actually the Belviq is a plan exclusion and costs $100/month even with the coupon card.

## 2016-02-16 NOTE — Telephone Encounter (Signed)
She can call insurance and see if they approve any weight loss medications such as saxenda, contrave, qsymia?

## 2016-05-21 ENCOUNTER — Ambulatory Visit (INDEPENDENT_AMBULATORY_CARE_PROVIDER_SITE_OTHER): Payer: Managed Care, Other (non HMO) | Admitting: Physician Assistant

## 2016-05-21 ENCOUNTER — Encounter: Payer: Self-pay | Admitting: Physician Assistant

## 2016-05-21 VITALS — BP 141/91 | HR 96 | Temp 97.6°F | Wt 202.0 lb

## 2016-05-21 DIAGNOSIS — H6982 Other specified disorders of Eustachian tube, left ear: Secondary | ICD-10-CM | POA: Diagnosis not present

## 2016-05-21 DIAGNOSIS — F330 Major depressive disorder, recurrent, mild: Secondary | ICD-10-CM | POA: Diagnosis not present

## 2016-05-21 DIAGNOSIS — E6609 Other obesity due to excess calories: Secondary | ICD-10-CM

## 2016-05-21 MED ORDER — VILAZODONE HCL 20 MG PO TABS: ORAL_TABLET | ORAL | 1 refills | Status: DC

## 2016-05-21 MED ORDER — PHENTERMINE HCL 37.5 MG PO TABS: 37.5000 mg | ORAL_TABLET | Freq: Every day | ORAL | 0 refills | Status: DC

## 2016-05-21 MED ORDER — METHYLPREDNISOLONE 4 MG PO TBPK: ORAL_TABLET | ORAL | 0 refills | Status: DC

## 2016-05-21 NOTE — Progress Notes (Addendum)
Subjective:    Patient ID: Stacey Alvarado, female    DOB: Oct 13, 1970, 46 y.o.   MRN: [id number]  HPI  Patient is a 46yo female who presents to the clinic with congestion, body aches, low-grade fever, and left ear pressure.  Patient states that her symptoms started with shooting ear pain that started 5 days ago, which has sense resolved to only a sense of pressure.  Patient denies sore throat, nausea, vomiting, diarrhea, or hemoptysis.  Patient states she has a mild occasional cough that is productive.  Patient states she did not get the flu shot, and she has been around people at work with similar symptoms.  Patient states she has tried taking Xyzal allergy for her congestion and ibuprofen for her body aches.    Patient would also like to discuss getting off of zoloft 100mg .  She feels the medication has not been effective, and she would like to try something different  She denies any side effects Patient states her depression and anxiety are worsening.  Patient denies any recent panic attacks.  Patient reports she has tried cymbalta, paxil, wellbutrin Xl, abilify, lexapro, and effexor XR.  Patient would like to discuss starting another weight loss medication.  She tried Belviq but it caused worsening emotional instability, so she discontinued it.  She would like to consider phentermine, and she is willing to stop adderall.        Review of Systems  HENT: Positive for congestion and ear pain.   All other systems reviewed and are negative.      Objective:   Physical Exam  Constitutional: She is oriented to person, place, and time. She appears well-developed and well-nourished.  HENT:  Head: Normocephalic and atraumatic.  Right Ear: External ear normal.  Left Ear: External ear normal.  Nose: Nose normal.  Mouth/Throat: Oropharynx is clear and moist.  No signs of erythema or bulging of right or left ear. Both TM intact  Right EAC slightly erythematous.     Eyes: Conjunctivae are  normal. Right eye exhibits no discharge. Left eye exhibits no discharge.  Cardiovascular: Normal rate, regular rhythm and normal heart sounds.   Pulmonary/Chest: Effort normal and breath sounds normal. She has no wheezes.  Lymphadenopathy:    She has no cervical adenopathy.  Neurological: She is alert and oriented to person, place, and time.  Skin: Skin is warm and dry.  Psychiatric: She has a normal mood and affect. Her behavior is normal.  Nursing note and vitals reviewed.         Assessment & Plan:  Marland KitchenMarland KitchenAlithia was seen today for left ear pain.  Diagnoses and all orders for this visit:  Dysfunction of left eustachian tube -     methylPREDNISolone (MEDROL DOSEPAK) 4 MG TBPK tablet; Take as directed by package insert.  Mild episode of recurrent major depressive disorder (HCC) -     Vilazodone HCl 20 MG TABS; Take one tablet daily.  Class 1 obesity due to excess calories without serious comorbidity in adult, unspecified BMI -     phentermine (ADIPEX-P) 37.5 MG tablet; Take 1 tablet (37.5 mg total) by mouth daily before breakfast.   Patient will be started on a Medrol dosepak 4mg  for her left ear pain.  Explained to patient that her ear pain and pressure are not due to an infection.  Patient can continue to use Xyzal for congestion relief.    Patient will continue to take half of her zoloft dose 50mg  and start half  of vilazodone 10mg  for one week.  Starting week two, take 20mg  of Vilazodone daily and discontinue zoloft.  Explained potential side effects.  Patient given a coupon for Vilazodone.   Patient started on Phentermine 37.5mg  for weight loss.  Side effect profile explained.  Patient taken off adderall.  Follow-up in 4 weeks for a weight check and to see how patient is doing on Vilazodone and weight loss.

## 2016-05-24 ENCOUNTER — Telehealth: Payer: Self-pay | Admitting: *Deleted

## 2016-05-24 NOTE — Telephone Encounter (Signed)
PA approved for Viibryd over the phone. (accidently submitted wrong info on covermymeds so had to call)approved for 36 months.   Called and left message on pharm vm.

## 2016-06-14 ENCOUNTER — Telehealth: Payer: Self-pay | Admitting: *Deleted

## 2016-06-14 NOTE — Telephone Encounter (Signed)
Pt left vm stating that Jade prescribed her Viibryd about 3 weeks ago.  She is currently still taking half tab but has been experiencing nausea the whole time on this medication.  Please advise.

## 2016-06-14 NOTE — Telephone Encounter (Signed)
It looks like she was recently on prednisone. That can also irritate the stomach. I recommend a trial of taking either Zantac or Prilosec OTC about 20-30 minutes before first and only day fair the next 4-5 days, probably through the weekend and see if this nausea is better. If it's not then it could be a side effect of the medication in which case we could consider changing it.  Nani Gasseratherine Sarajean Dessert, MD

## 2016-06-15 NOTE — Telephone Encounter (Signed)
LMOM for pt to return call. 

## 2016-06-18 ENCOUNTER — Ambulatory Visit (INDEPENDENT_AMBULATORY_CARE_PROVIDER_SITE_OTHER): Payer: Managed Care, Other (non HMO) | Admitting: Physician Assistant

## 2016-06-18 VITALS — BP 128/75 | HR 90 | Temp 98.0°F | Resp 16 | Wt 197.0 lb

## 2016-06-18 DIAGNOSIS — E6609 Other obesity due to excess calories: Secondary | ICD-10-CM

## 2016-06-18 DIAGNOSIS — R635 Abnormal weight gain: Secondary | ICD-10-CM

## 2016-06-18 MED ORDER — PHENTERMINE HCL 37.5 MG PO TABS: 37.5000 mg | ORAL_TABLET | Freq: Every day | ORAL | 0 refills | Status: DC

## 2016-06-18 NOTE — Progress Notes (Signed)
   Subjective:    Patient ID: Stacey Alvarado, female    DOB: 03/14/1971, 46 y.o.   MRN: [id number]  HPI Patient is here for blood pressure and weight check. Denies trouble sleeping, palpitations, or medication/phentermine problems. She is having trouble with her new rx for vilazodone and feels it is making her depression worse; crying a lot; nauseated and eating to reduce nausea.     Review of Systems     Objective:   Physical Exam        Assessment & Plan:  Patient has lost 5 pounds in past month. She will return in one month for physical, including pap and probably Tdap. Lesly RubensteinJade will review her chart and we will phone in rx for phentermine and a new anti-depressant.  Unclear if phentermine of viibyrd could be responsible for mood change. You have tried numerous anti-depressants. Would you like referral for pysch? I feel like at this point we have tried numerous medications with no relief? We could try trintellix as well. Let me know. Jade Breeback PA-C.

## 2016-06-19 ENCOUNTER — Telehealth: Payer: Self-pay | Admitting: Physician Assistant

## 2016-06-19 ENCOUNTER — Other Ambulatory Visit: Payer: Self-pay | Admitting: Physician Assistant

## 2016-06-19 MED ORDER — VORTIOXETINE HBR 10 MG PO TABS: ORAL_TABLET | ORAL | 1 refills | Status: DC

## 2016-06-19 NOTE — Telephone Encounter (Signed)
Per NV note yesterday, Pt would like to try Trintellix. Does request an Rx for non-drowsy nausea medication to help with the nausea incase new Rx requires PA. Routing for review.

## 2016-06-19 NOTE — Telephone Encounter (Signed)
Sent trintellix 10mg  to pharmacy. Can you fax them a coupon card as well.

## 2016-06-20 ENCOUNTER — Telehealth: Payer: Self-pay | Admitting: *Deleted

## 2016-06-20 ENCOUNTER — Other Ambulatory Visit: Payer: Self-pay

## 2016-06-20 DIAGNOSIS — R635 Abnormal weight gain: Secondary | ICD-10-CM

## 2016-06-20 DIAGNOSIS — E6609 Other obesity due to excess calories: Secondary | ICD-10-CM

## 2016-06-20 NOTE — Telephone Encounter (Signed)
[id number]  Pre Authorization sent to cover my meds.

## 2016-06-20 NOTE — Telephone Encounter (Signed)
Pt advised to visit website and print savings card. Verbalized understanding.

## 2016-07-16 ENCOUNTER — Ambulatory Visit: Payer: Managed Care, Other (non HMO) | Admitting: Physician Assistant

## 2016-07-16 ENCOUNTER — Ambulatory Visit (INDEPENDENT_AMBULATORY_CARE_PROVIDER_SITE_OTHER): Payer: Managed Care, Other (non HMO) | Admitting: Physician Assistant

## 2016-07-16 VITALS — BP 127/81 | HR 93 | Ht 68.0 in | Wt 199.1 lb

## 2016-07-16 DIAGNOSIS — R635 Abnormal weight gain: Secondary | ICD-10-CM

## 2016-07-16 DIAGNOSIS — F988 Other specified behavioral and emotional disorders with onset usually occurring in childhood and adolescence: Secondary | ICD-10-CM

## 2016-07-16 MED ORDER — AMPHETAMINE-DEXTROAMPHETAMINE 15 MG PO TABS: 15.0000 mg | ORAL_TABLET | Freq: Two times a day (BID) | ORAL | 0 refills | Status: DC

## 2016-07-16 NOTE — Progress Notes (Signed)
   Subjective:    Patient ID: Stacey Alvarado, female    DOB: 04/19/1971, 46 y.o.   MRN: [id number]  HPI Pt is here for a BP and weight check. Pt denies chest pain, shortness of breath, palpitations, headaches, or any problems with medication.     Review of Systems     Objective:   Physical Exam        Assessment & Plan:  Pt has not lost weight. A refill for phentermine will not be faxed to pharmacy. Pt wanted to be put back on Adderall since she is no longer taking phentermine. Rx printed and given to pt [facility] PA-C. Pt advised to schedule a follow-up with provider in 30 days. To discuss other long term medication for weight loss.

## 2016-08-13 ENCOUNTER — Ambulatory Visit (INDEPENDENT_AMBULATORY_CARE_PROVIDER_SITE_OTHER): Payer: Managed Care, Other (non HMO) | Admitting: Physician Assistant

## 2016-08-13 ENCOUNTER — Encounter: Payer: Self-pay | Admitting: Physician Assistant

## 2016-08-13 VITALS — BP 132/77 | HR 86 | Ht 68.0 in | Wt 196.0 lb

## 2016-08-13 DIAGNOSIS — F988 Other specified behavioral and emotional disorders with onset usually occurring in childhood and adolescence: Secondary | ICD-10-CM | POA: Diagnosis not present

## 2016-08-13 DIAGNOSIS — R5383 Other fatigue: Secondary | ICD-10-CM | POA: Diagnosis not present

## 2016-08-13 DIAGNOSIS — F331 Major depressive disorder, recurrent, moderate: Secondary | ICD-10-CM | POA: Diagnosis not present

## 2016-08-13 LAB — COMPLETE METABOLIC PANEL WITHOUT GFR
ALT: 28 U/L (ref 6–29)
AST: 26 U/L (ref 10–30)
Albumin: 4.6 g/dL (ref 3.6–5.1)
Alkaline Phosphatase: 79 U/L (ref 33–115)
BUN: 18 mg/dL (ref 7–25)
CO2: 22 mmol/L (ref 20–31)
Calcium: 9.9 mg/dL (ref 8.6–10.2)
Chloride: 105 mmol/L (ref 98–110)
Creat: 0.77 mg/dL (ref 0.50–1.10)
GFR, Est African American: >89 mL/min
GFR, Est Non African American: >89 mL/min
Glucose, Bld: 96 mg/dL (ref 65–99)
Potassium: 4.4 mmol/L (ref 3.5–5.3)
Sodium: 140 mmol/L (ref 135–146)
Total Bilirubin: 0.5 mg/dL (ref 0.2–1.2)
Total Protein: 7.1 g/dL (ref 6.1–8.1)

## 2016-08-13 LAB — T4, FREE: Free T4: 1.2 ng/dL (ref 0.8–1.8)

## 2016-08-13 LAB — TSH: TSH: 1.19 m[IU]/L

## 2016-08-13 MED ORDER — BUPROPION HCL ER (XL) 150 MG PO TB24: 150.0000 mg | ORAL_TABLET | Freq: Every day | ORAL | 2 refills | Status: DC

## 2016-08-13 MED ORDER — AMPHETAMINE-DEXTROAMPHETAMINE 15 MG PO TABS: 15.0000 mg | ORAL_TABLET | Freq: Two times a day (BID) | ORAL | 0 refills | Status: DC

## 2016-08-13 MED ORDER — SERTRALINE HCL 100 MG PO TABS: 100.0000 mg | ORAL_TABLET | Freq: Every day | ORAL | 5 refills | Status: DC

## 2016-08-13 NOTE — Progress Notes (Signed)
   Subjective:    Patient ID: Stacey Alvarado, female    DOB: 10/22/1970, 46 y.o.   MRN: [id number]  HPI  Pt is a 46 yo female who presents to the clinic for follow up on medications.   MDD- not able to afford trintellix.on zoloft daily. She has no energy or motivation. She is avoiding people and social events. She takes adderall and helps her mentally focus to do her job but then she goes home and goes to bed. No suicidal thoughts. She is not having any palpitations, headaches, insomnia.   Review of Systems  All other systems reviewed and are negative.      Objective:   Physical Exam  Constitutional: She is oriented to person, place, and time. She appears well-developed and well-nourished.  HENT:  Head: Normocephalic and atraumatic.  Cardiovascular: Normal rate, regular rhythm and normal heart sounds.   Pulmonary/Chest: Effort normal and breath sounds normal.  Neurological: She is alert and oriented to person, place, and time.  Psychiatric: Her behavior is normal.  Flat affect          Assessment & Plan:   Marland KitchenMarland KitchenMakaela was seen today for adhd and depression.  Diagnoses and all orders for this visit:  Moderate episode of recurrent major depressive disorder (HCC) -     COMPLETE METABOLIC PANEL WITH GFR -     W09 -     VITAMIN D 25 Hydroxy (Vit-D Deficiency, Fractures) -     TSH -     T4, free  Attention deficit disorder (ADD) without hyperactivity -     Discontinue: amphetamine-dextroamphetamine (ADDERALL) 15 MG tablet; Take 1 tablet by mouth 2 (two) times daily. -     Discontinue: amphetamine-dextroamphetamine (ADDERALL) 15 MG tablet; Take 1 tablet by mouth 2 (two) times daily. -     amphetamine-dextroamphetamine (ADDERALL) 15 MG tablet; Take 1 tablet by mouth 2 (two) times daily.  No energy -     COMPLETE METABOLIC PANEL WITH GFR -     W11 -     VITAMIN D 25 Hydroxy (Vit-D Deficiency, Fractures) -     TSH -     T4, free  Other orders -     buPROPion (WELLBUTRIN  XL) 150 MG 24 hr tablet; Take 1 tablet (150 mg total) by mouth daily. -     sertraline (ZOLOFT) 100 MG tablet; Take 1 tablet (100 mg total) by mouth daily.    .. Depression screen [facility] 2/9 08/13/2016  Decreased Interest 2  Down, Depressed, Hopeless 2  PHQ - 2 Score 4  Altered sleeping 0  Tired, decreased energy 1  Change in appetite 2  Feeling bad or failure about yourself  2  Trouble concentrating 1  Moving slowly or fidgety/restless 1  Suicidal thoughts 0  PHQ-9 Score 11    Refilled adderall for 3 months.  Added wellbutrin to zoloft. Follow up in 2-3 months. Discussed side effects.  Discussed exercise.  Fatigue panel ordered.  Discussed suspect fatigue is coming from depression.  Marland KitchenMarland Kitchen

## 2016-08-14 ENCOUNTER — Encounter: Payer: Self-pay | Admitting: Physician Assistant

## 2016-08-14 DIAGNOSIS — E538 Deficiency of other specified B group vitamins: Secondary | ICD-10-CM | POA: Insufficient documentation

## 2016-08-14 LAB — VITAMIN B12: VITAMIN B 12: 229 pg/mL (ref 200–1100)

## 2016-08-14 LAB — VITAMIN D 25 HYDROXY (VIT D DEFICIENCY, FRACTURES): VIT D 25 HYDROXY: 24 ng/mL — AB (ref 30–100)

## 2016-10-17 ENCOUNTER — Ambulatory Visit: Payer: Managed Care, Other (non HMO) | Admitting: Physician Assistant

## 2016-11-06 ENCOUNTER — Other Ambulatory Visit: Payer: Self-pay

## 2016-11-06 DIAGNOSIS — F988 Other specified behavioral and emotional disorders with onset usually occurring in childhood and adolescence: Secondary | ICD-10-CM

## 2016-11-06 MED ORDER — AMPHETAMINE-DEXTROAMPHETAMINE 15 MG PO TABS: 15.0000 mg | ORAL_TABLET | Freq: Two times a day (BID) | ORAL | 0 refills | Status: DC

## 2016-12-05 ENCOUNTER — Ambulatory Visit (INDEPENDENT_AMBULATORY_CARE_PROVIDER_SITE_OTHER): Payer: Managed Care, Other (non HMO) | Admitting: Physician Assistant

## 2016-12-05 ENCOUNTER — Encounter: Payer: Self-pay | Admitting: Physician Assistant

## 2016-12-05 VITALS — BP 132/87 | HR 96 | Ht 68.0 in | Wt 203.0 lb

## 2016-12-05 DIAGNOSIS — R229 Localized swelling, mass and lump, unspecified: Secondary | ICD-10-CM

## 2016-12-05 DIAGNOSIS — F331 Major depressive disorder, recurrent, moderate: Secondary | ICD-10-CM

## 2016-12-05 DIAGNOSIS — R202 Paresthesia of skin: Secondary | ICD-10-CM | POA: Diagnosis not present

## 2016-12-05 DIAGNOSIS — F988 Other specified behavioral and emotional disorders with onset usually occurring in childhood and adolescence: Secondary | ICD-10-CM | POA: Diagnosis not present

## 2016-12-05 DIAGNOSIS — E538 Deficiency of other specified B group vitamins: Secondary | ICD-10-CM | POA: Diagnosis not present

## 2016-12-05 MED ORDER — AMPHETAMINE-DEXTROAMPHETAMINE 20 MG PO TABS: 20.0000 mg | ORAL_TABLET | Freq: Two times a day (BID) | ORAL | 0 refills | Status: DC

## 2016-12-05 MED ORDER — CYANOCOBALAMIN 1000 MCG/ML IJ SOLN
1000.0000 ug | INTRAMUSCULAR | Status: DC
  Administered 2016-12-05: 1000 ug via INTRAMUSCULAR

## 2016-12-05 MED ORDER — SERTRALINE HCL 100 MG PO TABS: 200.0000 mg | ORAL_TABLET | Freq: Every day | ORAL | 5 refills | Status: DC

## 2016-12-05 NOTE — Progress Notes (Signed)
Subjective:    Patient ID: Stacey Alvarado, female    DOB: September 27, 1970, 46 y.o.   MRN: [id number]  HPI  Pt is a 46 yo female who presents to the clinic for follow up.   ADD- she is doing well but would like to see if increasing dose would make a difference in her motivation and focus. She has been on same dose for 3 years. Denies insomnia, palpitations.   MDD- she remains on zoloft because that is the only thing that has worked even a little for depression; however, she still has signifcant problems with motivation and lack of desire. She has absolutely no desire to be around anyone. wellbutrin caused insomnia. Effexor/viibryd caused her to be more anxious.   Pt can palpate a small lump in left forearm that when pressing on it sends tingling into left pinky and ring finger. Denies any trauma. Noticed for the last month. Does not seem to be getting bigger.   .. Active Ambulatory Problems    Diagnosis Date Noted  . Hypothyroidism 09/02/2007  . HYPERLIPIDEMIA 09/02/2007  . TOBACCO ABUSE 09/02/2007  . Depression 09/02/2007  . BACK PAIN, UPPER 05/30/2009  . DYSFUNCTION OF EUSTACHIAN TUBE 05/25/2010  . HAND PAIN 09/27/2010  . ADD (attention deficit disorder) 06/26/2013  . Cervical radiculitis 06/26/2013  . Multiple joint pain 07/07/2013  . 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 Vitamin D deficiency 05/07/2014  . Class 1 obesity due to excess calories without serious comorbidity in adult 02/14/2016  . No energy 08/13/2016  . B12 deficiency 08/14/2016  . Single skin nodule 12/10/2016  . Left hand paresthesia 12/10/2016   Resolved Ambulatory Problems    Diagnosis Date Noted  . LOM 12/16/2007  . BRONCHITIS, ACUTE 01/08/2008  . URI 05/25/2010  . Essential hypertension 01/17/2015   No Additional Past Medical History       Review of Systems    see HPI.  Objective:   Physical Exam  Constitutional: She is oriented to person, place, and time. She appears  well-developed and well-nourished.  HENT:  Head: Normocephalic and atraumatic.  Cardiovascular: Normal rate, regular rhythm and normal heart sounds.   Pulmonary/Chest: Effort normal and breath sounds normal.  Musculoskeletal:  Small nodule of left forearm on ulnar side midway up arm. Non tender, no swelling.  When pushed send tingling into pinky and ring finger of left hand.  Upper extremity strength 5/5.  Neurological: She is alert and oriented to person, place, and time.  Skin: Skin is dry.  Psychiatric: Her behavior is normal.  Flat affect.           Assessment & Plan:  Marland KitchenMarland KitchenJannel was seen today for follow-up.  Diagnoses and all orders for this visit:  Moderate episode of recurrent major depressive disorder (HCC) -     sertraline (ZOLOFT) 100 MG tablet; Take 2 tablets (200 mg total) by mouth daily.  Attention deficit disorder (ADD) without hyperactivity -     amphetamine-dextroamphetamine (ADDERALL) 20 MG tablet; Take 1 tablet (20 mg total) by mouth 2 (two) times daily. Do not refill until 02/04/17.  B12 deficiency -     cyanocobalamin ((VITAMIN B-12)) injection 1,000 mcg; Inject 1 mL (1,000 mcg total) into the muscle every 30 (thirty) days.  Single skin nodule  Left hand paresthesia  Other orders -     Cancel: amphetamine-dextroamphetamine (ADDERALL) 15 MG tablet; Take 1 tablet by mouth 2 (two) times daily. -     Discontinue: amphetamine-dextroamphetamine (ADDERALL) 20 MG  tablet; Take 1 tablet (20 mg total) by mouth 2 (two) times daily. -     Discontinue: amphetamine-dextroamphetamine (ADDERALL) 20 MG tablet; Take 1 tablet (20 mg total) by mouth 2 (two) times daily. Do not refill until 01/05/17.   Increased adderall just a tad to see if helped.   [facility] 2/9 12/05/2016 08/13/2016  Decreased Interest 3 2  Down, Depressed, Hopeless 3 2  PHQ - 2 Score 6 4  Altered sleeping 0 0  Tired, decreased energy 2 1  Change in appetite 3 2  Feeling bad or failure  about yourself  2 2  Trouble concentrating 2 1  Moving slowly or fidgety/restless 1 1  Suicidal thoughts 0 0  PHQ-9 Score 16 11   Increased zoloft to max dose. Discussed medication change but she has not done well with this in the past. Consider referral to psychiatry. Pt declined counseling stating it does not help. Follow up in 6 weeks.   Reassured patient that nodule appears to be lipoma vs ganglion cyst. Watch for growth. If changing can get imaging. Symptoms only occur with manipulation. Keep hands off.   b12 and vitamin D low on labs add oral supplementation at 1000mcg and 1000 units.

## 2016-12-05 NOTE — Patient Instructions (Signed)
Start OTC b12 1000mcg and vitamin D 1000 units.  Increase zoloft to 200mg  daily.  Increased adderall.

## 2016-12-10 ENCOUNTER — Encounter: Payer: Self-pay | Admitting: Physician Assistant

## 2016-12-10 DIAGNOSIS — R202 Paresthesia of skin: Secondary | ICD-10-CM | POA: Insufficient documentation

## 2016-12-10 DIAGNOSIS — R229 Localized swelling, mass and lump, unspecified: Secondary | ICD-10-CM | POA: Insufficient documentation

## 2017-02-04 ENCOUNTER — Telehealth: Payer: Self-pay | Admitting: Physician Assistant

## 2017-02-04 NOTE — Telephone Encounter (Signed)
Pt called. Requesting refill  adderall.  Thanks

## 2017-02-04 NOTE — Telephone Encounter (Signed)
At last visit, enough Rx written to last through October. Per controlled substance database, Rx dated for October 2018 has not been filled. We did received a PA request for Adderall, not completed.  Left VM to see if Pt needs a new Rx or if she is referred to the completion on a PA so she can get it filled.

## 2017-02-05 NOTE — Telephone Encounter (Signed)
Pre Authorization sent to cover my meds. [facility]

## 2017-02-06 NOTE — Telephone Encounter (Signed)
Medication has been approved.  Pharmacy and patient notified

## 2017-03-08 ENCOUNTER — Ambulatory Visit (INDEPENDENT_AMBULATORY_CARE_PROVIDER_SITE_OTHER): Payer: 59 | Admitting: Physician Assistant

## 2017-03-08 ENCOUNTER — Encounter: Payer: Self-pay | Admitting: Physician Assistant

## 2017-03-08 VITALS — BP 137/77 | HR 95 | Ht 67.99 in | Wt 206.0 lb

## 2017-03-08 DIAGNOSIS — F331 Major depressive disorder, recurrent, moderate: Secondary | ICD-10-CM | POA: Diagnosis not present

## 2017-03-08 DIAGNOSIS — F988 Other specified behavioral and emotional disorders with onset usually occurring in childhood and adolescence: Secondary | ICD-10-CM | POA: Diagnosis not present

## 2017-03-08 DIAGNOSIS — R05 Cough: Secondary | ICD-10-CM | POA: Diagnosis not present

## 2017-03-08 DIAGNOSIS — R059 Cough, unspecified: Secondary | ICD-10-CM

## 2017-03-08 MED ORDER — AMPHETAMINE-DEXTROAMPHETAMINE 20 MG PO TABS: 20.0000 mg | ORAL_TABLET | Freq: Two times a day (BID) | ORAL | 0 refills | Status: DC

## 2017-03-08 MED ORDER — AZITHROMYCIN 250 MG PO TABS: ORAL_TABLET | ORAL | 0 refills | Status: DC

## 2017-03-08 MED ORDER — METHYLPREDNISOLONE ACETATE 40 MG/ML IJ SUSP
40.0000 mg | Freq: Once | INTRAMUSCULAR | Status: AC
  Administered 2017-03-08: 40 mg via INTRAMUSCULAR

## 2017-03-08 NOTE — Patient Instructions (Signed)
Neurostar for depression.  Trintellix switch

## 2017-03-08 NOTE — Progress Notes (Signed)
Subjective:    Patient ID: Stacey Alvarado, female    DOB: 04/01/1971, 46 y.o.   MRN: [id number]  HPI  Patient is a [age] year old female who presents to the clinic for her 4476-month follow-up.  ADD-patient is doing well on her Adderall.  She is much more focused and productive at work.  She denies any insomnia, anxiety, palpitations.  MDD-her depression and anhedonia is still present.  She feels pretty stable and may be a little bit better from last visit.  We increased Zoloft to 200 mg daily.  She denies any suicidal or homicidal thoughts.  She just has no desire to do anything other than what she has to do to survive.  She finds no pleasure in life.  There is nothing overtly wrong.  She does not feel like counseling is effective.  She has not been to psychiatry however she finds it difficult to make room for any other appointments due to her work schedule. Patient has tried multiple medicines for depression and failed.  She has tried Cymbalta, Paxil, Effexor, Lexapro, Zoloft, Viibryd, Wellbutrin.  Zoloft seems to work the best.  Patient is a current smoker and has had a dry cough for the last 2 weeks.  Started with more upper respiratory symptoms but those have cleared.  She denies any fever, chills, sinus pressure, headache, ear pain.  She does not feel any shortness of breath but does feel some tightness.  .. Active Ambulatory Problems    Diagnosis Date Noted  . Hypothyroidism 09/02/2007  . HYPERLIPIDEMIA 09/02/2007  . TOBACCO ABUSE 09/02/2007  . Depression 09/02/2007  . BACK PAIN, UPPER 05/30/2009  . DYSFUNCTION OF EUSTACHIAN TUBE 05/25/2010  . HAND PAIN 09/27/2010  . ADD (attention deficit disorder) 06/26/2013  . Cervical radiculitis 06/26/2013  . Multiple joint pain 07/07/2013  . 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 Vitamin D deficiency 05/07/2014  . Class 1 obesity due to excess calories without serious comorbidity in adult 02/14/2016  . No energy 08/13/2016   . B12 deficiency 08/14/2016  . Single skin nodule 12/10/2016  . Left hand paresthesia 12/10/2016   Resolved Ambulatory Problems    Diagnosis Date Noted  . LOM 12/16/2007  . BRONCHITIS, ACUTE 01/08/2008  . URI 05/25/2010  . Essential hypertension 01/17/2015   No Additional Past Medical History      Review of Systems  All other systems reviewed and are negative.      Objective:   Physical Exam  Constitutional: She is oriented to person, place, and time. She appears well-developed and well-nourished.  HENT:  Head: Normocephalic and atraumatic.  Right Ear: External ear normal.  Left Ear: External ear normal.  Nose: Nose normal.  Mouth/Throat: Oropharynx is clear and moist. No oropharyngeal exudate.  TM's clear bilaterally.  Negative for sinus tenderness to palpation.   Eyes: Conjunctivae are normal.  Neck: Normal range of motion. Neck supple. No thyromegaly present.  Pulmonary/Chest: Effort normal.  Expiratory wheezing at base of both lungs. Rhonchi bilateral lungs.   Lymphadenopathy:    She has no cervical adenopathy.  Neurological: She is alert and oriented to person, place, and time.  Psychiatric: She has a normal mood and affect. Her behavior is normal.          Assessment & Plan:  Marland Kitchen.Marland Kitchen.Stacey Alvarado was seen today for depression and cough.  Diagnoses and all orders for this visit:  Attention deficit disorder (ADD) without hyperactivity -     Discontinue: amphetamine-dextroamphetamine (ADDERALL) 20 MG tablet; Take  1 tablet (20 mg total) 2 (two) times daily by mouth. -     Discontinue: amphetamine-dextroamphetamine (ADDERALL) 20 MG tablet; Take 1 tablet (20 mg total) 2 (two) times daily by mouth. Do not refill until 04/06/17. -     amphetamine-dextroamphetamine (ADDERALL) 20 MG tablet; Take 1 tablet (20 mg total) 2 (two) times daily by mouth. Do not refill until 05/07/17.  Cough -     azithromycin (ZITHROMAX) 250 MG tablet; Take 2 tablets now and then one tablet for 4  days.  Moderate episode of recurrent major depressive disorder (HCC)   .Marland Kitchen. [facility] 2[facility] 03/08/2017 12/05/2016 08/13/2016  Decreased Interest 3 3 2   Down, Depressed, Hopeless 2 3 2   PHQ - 2 Score 5 6 4   Altered sleeping 1 0 0  Tired, decreased energy 2 2 1   Change in appetite 3 3 2   Feeling bad or failure about yourself  2 2 2   Trouble concentrating 1 2 1   Moving slowly or fidgety/restless 0 1 1  Suicidal thoughts 0 0 0  PHQ-9 Score 14 16 11   Difficult doing work/chores Somewhat difficult - -   .Marland Kitchen. GAD 7 : Generalized Anxiety Score 03/08/2017  Nervous, Anxious, on Edge 1  Control/stop worrying 1  Worry too much - different things 1  Trouble relaxing 1  Restless 0  Easily annoyed or irritable 3  Afraid - awful might happen 1  Total GAD 7 Score 8    Adderall was refilled for 3 months.  Follow-up in 3 months.  Discussed in detail depression.  She is hesitant to try any new medications close to the holiday season for fear of worsening depression.  I discussed her intolerance she will consider trintellix and call back in January to see if she would like to try the switch.  I think she would be a good candidate for the neurostar depression treatment. I do not know a lot about this. I will contact [facility] downstairs to see what their opinion is.   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 Pt is usually asymptomatic. Pt is not ready for smoking cessation. Pt does not tolerate oral prednisone. Will treat with depo medrol today IM. If not improving or cough becoming more productive. zpak given to start in next 2-4 days.

## 2017-03-08 NOTE — Addendum Note (Signed)
Addended by: Donne AnonBENDER, Chales Pelissier L on: 03/08/2017 09:47 AM   Modules accepted: Orders

## 2017-05-09 ENCOUNTER — Other Ambulatory Visit: Payer: Self-pay | Admitting: *Deleted

## 2017-05-09 DIAGNOSIS — F331 Major depressive disorder, recurrent, moderate: Secondary | ICD-10-CM

## 2017-05-09 MED ORDER — SERTRALINE HCL 100 MG PO TABS: 200.0000 mg | ORAL_TABLET | Freq: Every day | ORAL | 0 refills | Status: DC

## 2017-05-31 ENCOUNTER — Ambulatory Visit (INDEPENDENT_AMBULATORY_CARE_PROVIDER_SITE_OTHER): Payer: 59 | Admitting: Physician Assistant

## 2017-05-31 ENCOUNTER — Encounter: Payer: Self-pay | Admitting: Physician Assistant

## 2017-05-31 ENCOUNTER — Ambulatory Visit: Payer: 59 | Admitting: Physician Assistant

## 2017-05-31 VITALS — BP 132/76 | HR 83 | Ht 68.0 in | Wt 210.0 lb

## 2017-05-31 DIAGNOSIS — F331 Major depressive disorder, recurrent, moderate: Secondary | ICD-10-CM

## 2017-05-31 DIAGNOSIS — F988 Other specified behavioral and emotional disorders with onset usually occurring in childhood and adolescence: Secondary | ICD-10-CM

## 2017-05-31 MED ORDER — AMPHETAMINE-DEXTROAMPHETAMINE 20 MG PO TABS: 20.0000 mg | ORAL_TABLET | Freq: Two times a day (BID) | ORAL | 0 refills | Status: DC

## 2017-05-31 MED ORDER — VORTIOXETINE HBR 10 MG PO TABS: 1.0000 | ORAL_TABLET | Freq: Every day | ORAL | 1 refills | Status: DC

## 2017-05-31 NOTE — Progress Notes (Signed)
Subjective:    Patient ID: Stacey Alvarado, female    DOB: 08-17-1970, 47 y.o.   MRN: [id number]  HPI  Patient is a 47 year old female with MDD, ADD, hypothyroidism who reports to the clinic to discuss depression and medication adjustment.  In her past she is previously been prescribed multiple antidepressants.  She is always gone back to Zoloft.  It has been the best of all the when she is tried.  She is unhappy with her current symptoms.  She feels like she has no energy or motivation to do anything.  She finds little to no pleasure in doing the same things that she is to find pleasure in.  She would like to want to hang out with friends and family again.  She denies any suicidal thoughts or homicidal ideations.  She is interested in trying something else for this.  She would like something that would not make her gain weight. In the past she has tried Pristiq, Cymbalta, Wellbutrin, Effexor, Viibryd.   ADD- pt is doing well today.  She feels like she is adequately focus at work.  She denies any increased anxiety, insomnia, palpitations.  She does need a refill today.  .. Active Ambulatory Problems    Diagnosis Date Noted  . Hypothyroidism 09/02/2007  . HYPERLIPIDEMIA 09/02/2007  . TOBACCO ABUSE 09/02/2007  . Depression 09/02/2007  . BACK PAIN, UPPER 05/30/2009  . DYSFUNCTION OF EUSTACHIAN TUBE 05/25/2010  . HAND PAIN 09/27/2010  . ADD (attention deficit disorder) 06/26/2013  . Cervical radiculitis 06/26/2013  . Multiple joint pain 07/07/2013  . 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 Vitamin D deficiency 05/07/2014  . Class 1 obesity due to excess calories without serious comorbidity in adult 02/14/2016  . No energy 08/13/2016  . B12 deficiency 08/14/2016  . Single skin nodule 12/10/2016  . Left hand paresthesia 12/10/2016   Resolved Ambulatory Problems    Diagnosis Date Noted  . LOM 12/16/2007  . BRONCHITIS, ACUTE 01/08/2008  . URI 05/25/2010  .  Essential hypertension 01/17/2015   No Additional Past Medical History       Review of Systems  All other systems reviewed and are negative.      Objective:   Physical Exam  Constitutional: She is oriented to person, place, and time. She appears well-developed and well-nourished.  HENT:  Head: Normocephalic and atraumatic.  Cardiovascular: Normal rate, regular rhythm and normal heart sounds.  Pulmonary/Chest: Effort normal and breath sounds normal. She has no wheezes.  Neurological: She is alert and oriented to person, place, and time.  Psychiatric:  Flat affect          Assessment & Plan:  Marland KitchenMarland KitchenCareen was seen today for depression and adhd.  Diagnoses and all orders for this visit:  Moderate episode of recurrent major depressive disorder (HCC) -     vortioxetine HBr (TRINTELLIX) 10 MG TABS; Take 1 tablet (10 mg total) by mouth daily.  Attention deficit disorder (ADD) without hyperactivity -     Discontinue: amphetamine-dextroamphetamine (ADDERALL) 20 MG tablet; Take 1 tablet (20 mg total) by mouth 2 (two) times daily. -     Discontinue: amphetamine-dextroamphetamine (ADDERALL) 20 MG tablet; Take 1 tablet (20 mg total) by mouth 2 (two) times daily. -     amphetamine-dextroamphetamine (ADDERALL) 20 MG tablet; Take 1 tablet (20 mg total) by mouth 2 (two) times daily.   .. Depression screen [facility] 2/9 05/31/2017 03/08/2017 12/05/2016 08/13/2016  Decreased Interest 3 3 3 2   Down, Depressed,  Hopeless 2 2 3 2   PHQ - 2 Score 5 5 6 4   Altered sleeping 1 1 0 0  Tired, decreased energy 2 2 2 1   Change in appetite 2 3 3 2   Feeling bad or failure about yourself  2 2 2 2   Trouble concentrating 1 1 2 1   Moving slowly or fidgety/restless 1 0 1 1  Suicidal thoughts 0 0 0 0  PHQ-9 Score 14 14 16 11   Difficult doing work/chores Very difficult Somewhat difficult - -   .Marland Kitchen. GAD 7 : Generalized Anxiety Score 05/31/2017 03/08/2017  Nervous, Anxious, on Edge 1 1  Control/stop worrying 1 1   Worry too much - different things 1 1  Trouble relaxing 1 1  Restless 1 0  Easily annoyed or irritable 2 3  Afraid - awful might happen 0 1  Total GAD 7 Score 7 8  Anxiety Difficulty Somewhat difficult -   Pt has tried multiple anti-depressants. See HPI.  Will taper off zoloft and titrate up on trintellix. Given coupon card. Discussed side effects.  Discussion about interest in TMS. She is not currently interested. Gave HO.  Follow up in 2 months.   Adderall refilled for 3 months.   Marland Kitchen..Spent 30 minutes with patient and greater than 50 percent of visit spent counseling patient regarding treatment plan.

## 2017-05-31 NOTE — Patient Instructions (Signed)
Transmagnetic Stimulation- Neurostar treatment to look up.   1/2 tablet for zoloft with 1/2 tablet for trintellix for 7 days. Stop zoloft then start full tablet of trintellix.

## 2017-06-05 ENCOUNTER — Telehealth: Payer: Self-pay | Admitting: Physician Assistant

## 2017-06-05 NOTE — Telephone Encounter (Signed)
[facility]ell[facility] I will notify pharmacy. - CF  File ID: [id number] Valid: 06/05/2017 - 06/05/2018

## 2017-06-05 NOTE — Telephone Encounter (Signed)
PA submitted on covermymeds.  Awaiting approval

## 2017-06-15 ENCOUNTER — Encounter: Payer: Self-pay | Admitting: Emergency Medicine

## 2017-06-15 ENCOUNTER — Emergency Department (INDEPENDENT_AMBULATORY_CARE_PROVIDER_SITE_OTHER)
Admission: EM | Admit: 2017-06-15 | Discharge: 2017-06-15 | Disposition: A | Discharge status: Home / Self Care | Payer: 59 | Source: Home / Self Care

## 2017-06-15 DIAGNOSIS — J0120 Acute ethmoidal sinusitis, unspecified: Secondary | ICD-10-CM

## 2017-06-15 MED ORDER — AMOXICILLIN-POT CLAVULANATE 875-125 MG PO TABS: 1.0000 | ORAL_TABLET | Freq: Two times a day (BID) | ORAL | 0 refills | Status: DC

## 2017-06-15 NOTE — ED Triage Notes (Signed)
[facility], nausea without vomiting bilateral ear pain. Nasal congestion, dizziness intermittently.

## 2017-06-15 NOTE — Discharge Instructions (Signed)
Return if any problems.

## 2017-06-17 NOTE — ED Provider Notes (Signed)
[facility]    CSN: [id number] Arrival date & time: 06/15/17  1352     History   Chief Complaint Chief Complaint  Patient presents with  . Otalgia  . Headache  . Nausea    HPI Stacey Alvarado is a 47 y.o. female.   The history is provided by the patient. No language interpreter was used.  Otalgia  Location:  Bilateral Quality:  Aching Severity:  Moderate Onset quality:  Gradual Chronicity:  New Relieved by:  Nothing Worsened by:  Nothing Associated symptoms: headaches   Headache  Associated symptoms: ear pain   Pt complains of sinus pressure headaache and anusea.   History reviewed. No pertinent past medical history.  Patient Active Problem List   Diagnosis Date Noted  . Single skin nodule 12/10/2016  . Left hand paresthesia 12/10/2016  . B12 deficiency 08/14/2016  . No energy 08/13/2016  . Class 1 obesity due to excess calories without serious comorbidity in adult 02/14/2016  . Vitamin D deficiency 05/07/2014  . 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 Multiple joint pain 07/07/2013  . ADD (attention deficit disorder) 06/26/2013  . Cervical radiculitis 06/26/2013  . HAND PAIN 09/27/2010  . DYSFUNCTION OF EUSTACHIAN TUBE 05/25/2010  . BACK PAIN, UPPER 05/30/2009  . Hypothyroidism 09/02/2007  . HYPERLIPIDEMIA 09/02/2007  . TOBACCO ABUSE 09/02/2007  . Depression 09/02/2007    Past Surgical History:  Procedure Laterality Date  . ABDOMINAL HYSTERECTOMY    . CARPAL TUNNEL RELEASE    . CHOLECYSTECTOMY    . TONSILLECTOMY      OB History    No data available       Home Medications    Prior to Admission medications   Medication Sig Start Date End Date Taking? Authorizing Provider  amoxicillin-clavulanate (AUGMENTIN) 875-125 MG tablet Take 1 tablet by mouth every 12 (twelve) hours. 06/15/17   Elson Areas, PA-C  amphetamine-dextroamphetamine (ADDERALL) 20 MG tablet Take 1 tablet (20 mg total) by mouth 2 (two) times  daily. 05/31/17   Breeback, Jade L, PA-C  sertraline (ZOLOFT) 100 MG tablet Take 2 tablets (200 mg total) by mouth daily. Need follow up with pcp. 05/09/17   Breeback, Jade L, PA-C  vortioxetine HBr (TRINTELLIX) 10 MG TABS Take 1 tablet (10 mg total) by mouth daily. 05/31/17   Jomarie Longs, PA-C    Family History History reviewed. No pertinent family history.  Social History Social History   Tobacco Use  . Smoking status: Current Every Day Smoker    Types: Cigarettes  . Smokeless tobacco: Never Used  . Tobacco comment: vapor cigs  Substance Use Topics  . Alcohol use: No  . Drug use: Not on file     Allergies   Belviq [lorcaserin hcl]; Morphine; Moxifloxacin; and Wellbutrin [bupropion]   Review of Systems Review of Systems  HENT: Positive for ear pain.   Neurological: Positive for headaches.  All other systems reviewed and are negative.    Physical Exam Triage Vital Signs ED Triage Vitals  Enc Vitals Group     BP --      Pulse Rate 06/15/17 1429 83     Resp 06/15/17 1429 16     Temp 06/15/17 1429 97.9 F (36.6 C)     Temp Source 06/15/17 1429 Oral     SpO2 06/15/17 1429 97 %     Weight 06/15/17 1430 200 lb (90.7 kg)     Height 06/15/17 1430 5\' 8"  (1.727 m)  Head Circumference --      Peak Flow --      Pain Score 06/15/17 1430 4     Pain Loc --      Pain Edu? --      Excl. in GC? --    No data found.  Updated Vital Signs Pulse 83   Temp 97.9 F (36.6 C) (Oral)   Resp 16   Ht 5\' 8"  (1.727 m)   Wt 200 lb (90.7 kg)   SpO2 97%   BMI 30.41 kg/m   Visual Acuity Right Eye Distance:   Left Eye Distance:   Bilateral Distance:    Right Eye Near:   Left Eye Near:    Bilateral Near:     Physical Exam  Constitutional: She appears well-developed and well-nourished. No distress.  HENT:  Head: Normocephalic and atraumatic.  Tender sinuses bilat  Eyes: Conjunctivae are normal. Right eye exhibits normal extraocular motion. Left eye exhibits normal  extraocular motion.  Neck: Neck supple.  Cardiovascular: Normal rate and regular rhythm.  No murmur heard. Pulmonary/Chest: Effort normal and breath sounds normal. No respiratory distress.  Abdominal: Soft. There is no tenderness.  Musculoskeletal: She exhibits no edema.  Neurological: She is alert.  Skin: Skin is warm and dry.  Psychiatric: She has a normal mood and affect.  Nursing note and vitals reviewed.    UC Treatments / Results  Labs (all labs ordered are listed, but only abnormal results are displayed) Labs Reviewed - No data to display  EKG  EKG Interpretation None       Radiology No results found.  Procedures Procedures (including critical care time)  Medications Ordered in UC Medications - No data to display   Initial Impression / Assessment and Plan / UC Course  I have reviewed the triage vital signs and the nursing notes.  Pertinent labs & imaging results that were available during my care of the patient were reviewed by me and considered in my medical decision making (see chart for details).       Final Clinical Impressions(s) / UC Diagnoses   Final diagnoses:  Acute ethmoidal sinusitis, recurrence not specified    ED Discharge Orders        Ordered    amoxicillin-clavulanate (AUGMENTIN) 875-125 MG tablet  Every 12 hours     06/15/17 1515     An After Visit Summary was printed and given to the patient.   Controlled Substance Prescriptions [place] Controlled Substance Registry consulted? Not Applicable   Elson AreasSofia, Genavive Kubicki K, [place]-C 06/17/17 1536

## 2017-07-11 ENCOUNTER — Encounter: Payer: Self-pay | Admitting: Family Medicine

## 2017-07-11 ENCOUNTER — Ambulatory Visit: Payer: 59 | Admitting: Family Medicine

## 2017-07-11 VITALS — BP 131/81 | HR 90 | Ht 68.0 in | Wt 206.0 lb

## 2017-07-11 DIAGNOSIS — Z23 Encounter for immunization: Secondary | ICD-10-CM | POA: Diagnosis not present

## 2017-07-11 DIAGNOSIS — E038 Other specified hypothyroidism: Secondary | ICD-10-CM

## 2017-07-11 DIAGNOSIS — E538 Deficiency of other specified B group vitamins: Secondary | ICD-10-CM

## 2017-07-11 DIAGNOSIS — E559 Vitamin D deficiency, unspecified: Secondary | ICD-10-CM | POA: Diagnosis not present

## 2017-07-11 DIAGNOSIS — F331 Major depressive disorder, recurrent, moderate: Secondary | ICD-10-CM | POA: Diagnosis not present

## 2017-07-11 DIAGNOSIS — R42 Dizziness and giddiness: Secondary | ICD-10-CM

## 2017-07-11 DIAGNOSIS — H6983 Other specified disorders of Eustachian tube, bilateral: Secondary | ICD-10-CM

## 2017-07-11 MED ORDER — ONDANSETRON HCL 4 MG PO TABS: 4.0000 mg | ORAL_TABLET | Freq: Three times a day (TID) | ORAL | 1 refills | Status: DC | PRN

## 2017-07-11 MED ORDER — PREDNISONE 10 MG PO TABS: 30.0000 mg | ORAL_TABLET | Freq: Every day | ORAL | 0 refills | Status: DC

## 2017-07-11 NOTE — Patient Instructions (Addendum)
Thank you for coming in today. Take the prednisone  Use the zofran as needed.  Get labs now.  Follow up with ENT.   Vertigo Vertigo is the feeling that you or your surroundings are moving when they are not. Vertigo can be dangerous if it occurs while you are doing something that could endanger you or others, such as driving. What are the causes? This condition is caused by a disturbance in the signals that are sent by your body's sensory systems to your brain. Different causes of a disturbance can lead to vertigo, including:  Infections, especially in the inner ear.  A bad reaction to a drug, or misuse of alcohol and medicines.  Withdrawal from drugs or alcohol.  Quickly changing positions, as when lying down or rolling over in bed.  Migraine headaches.  Decreased blood flow to the brain.  Decreased blood pressure.  Increased pressure in the brain from a head or neck injury, stroke, infection, tumor, or bleeding.  Central nervous system disorders.  What are the signs or symptoms? Symptoms of this condition usually occur when you move your head or your eyes in different directions. Symptoms may start suddenly, and they usually last for less than a minute. Symptoms may include:  Loss of balance and falling.  Feeling like you are spinning or moving.  Feeling like your surroundings are spinning or moving.  Nausea and vomiting.  Blurred vision or double vision.  Difficulty hearing.  Slurred speech.  Dizziness.  Involuntary eye movement (nystagmus).  Symptoms can be mild and cause only slight annoyance, or they can be severe and interfere with daily life. Episodes of vertigo may return (recur) over time, and they are often triggered by certain movements. Symptoms may improve over time. How is this diagnosed? This condition may be diagnosed based on medical history and the quality of your nystagmus. Your health care provider may test your eye movements by asking you to  quickly change positions to trigger the nystagmus. This may be called the Dix-Hallpike test, head thrust test, or roll test. You may be referred to a health care provider who specializes in ear, nose, and throat (ENT) problems (otolaryngologist) or a provider who specializes in disorders of the central nervous system (neurologist). You may have additional testing, including:  A physical exam.  Blood tests.  MRI.  A CT scan.  An electrocardiogram (ECG). This records electrical activity in your heart.  An electroencephalogram (EEG). This records electrical activity in your brain.  Hearing tests.  How is this treated? Treatment for this condition depends on the cause and the severity of the symptoms. Treatment options include:  Medicines to treat nausea or vertigo. These are usually used for severe cases. Some medicines that are used to treat other conditions may also reduce or eliminate vertigo symptoms. These include: ? Medicines that control allergies (antihistamines). ? Medicines that control seizures (anticonvulsants). ? Medicines that relieve depression (antidepressants). ? Medicines that relieve anxiety (sedatives).  Head movements to adjust your inner ear back to normal. If your vertigo is caused by an ear problem, your health care provider may recommend certain movements to correct the problem.  Surgery. This is rare.  Follow these instructions at home: Safety  Move slowly.Avoid sudden body or head movements.  Avoid driving.  Avoid operating heavy machinery.  Avoid doing any tasks that would cause danger to you or others if you would have a vertigo episode during the task.  If you have trouble walking or keeping your balance,  try using a cane for stability. If you feel dizzy or unstable, sit down right away.  Return to your normal activities as told by your health care provider. Ask your health care provider what activities are safe for you. General  instructions  Take over-the-counter and prescription medicines only as told by your health care provider.  Avoid certain positions or movements as told by your health care provider.  Drink enough fluid to keep your urine clear or pale yellow.  Keep all follow-up visits as told by your health care provider. This is important. Contact a health care provider if:  Your medicines do not relieve your vertigo or they make it worse.  You have a fever.  Your condition gets worse or you develop new symptoms.  Your family or friends notice any behavioral changes.  Your nausea or vomiting gets worse.  You have numbness or a "pins and needles" sensation in part of your body. Get help right away if:  You have difficulty moving or speaking.  You are always dizzy.  You faint.  You develop severe headaches.  You have weakness in your hands, arms, or legs.  You have changes in your hearing or vision.  You develop a stiff neck.  You develop sensitivity to light. This information is not intended to replace advice given to you by your health care provider. Make sure you discuss any questions you have with your health care provider. Document Released: 01/17/2005 Document Revised: 09/21/2015 Document Reviewed: 08/02/2014 Elsevier Interactive Patient Education  [employer].

## 2017-07-11 NOTE — Progress Notes (Signed)
Stacey StandsValerie E Alvarado is a 47 y.o. female who [facility] today for ear pressure, nausea, dizziness.Vikki Ports.  Azalea notes a several week history of ongoing bilateral ear pressure and occasional pain.  This seems to be associated with a dizziness sensation.  She notes often she experiences brief episodes of dizziness that she describes as either feeling unstable or unsteady or swimmy headed or sometimes as room spinning.  She has a history of eustachian tube dysfunction and notes that in the past she has had vertigo but does not know the fundamental underlying diagnosis.  She has been using Dramamine which does help.  Patient notes that she was thinking about making a follow-up appointment with [facility] but notes that the visits are over a month out.  She is taking Claritin-D occasionally using Flonase nasal spray but notes that does not help much.  Along with all of this she notes nausea without vomiting or diarrhea.  She notes the nausea will occasionally occur with the ear symptoms and occasionally with the unsteadiness or dizziness but not always.  She denies any abdominal pain and is able to eat or drink normally.  She notes the nausea does not seem to be related to bowel movements or urination.  She has had a medication change in the past several months where she was switched from Zoloft to Trintellix but notes that her symptoms did not really start around that time.  6 months ago but notes that that has not changed her symptoms much.  Additionally her oxycodone dose did increase about 6 months ago with no change in underlying symptoms.  She notes she does not have constipation because she eats lots of fiber.   No past medical history on file. Past Surgical History:  Procedure Laterality Date  . ABDOMINAL HYSTERECTOMY    . CARPAL TUNNEL RELEASE    . CHOLECYSTECTOMY    .  TONSILLECTOMY     Social History   Tobacco Use  . Smoking status: Current Every Day Smoker    Types: Cigarettes  . Smokeless tobacco: Never Used  . Tobacco comment: vapor cigs  Substance Use Topics  . Alcohol use: No   family history is not on file.  ROS as above:  Medications: Current Outpatient Medications  Medication Sig Dispense Refill  . amphetamine-dextroamphetamine (ADDERALL) 20 MG tablet Take 1 tablet (20 mg total) by mouth 2 (two) times daily. 60 tablet 0  . vortioxetine HBr (TRINTELLIX) 10 MG TABS Take 1 tablet (10 mg total) by mouth daily. 30 tablet 1  . ondansetron (ZOFRAN) 4 MG tablet Take 1 tablet (4 mg total) by mouth every 8 (eight) hours as needed for nausea or vomiting. 30 tablet 1  . oxyCODONE (ROXICODONE) 15 MG immediate release tablet Take 15 mg by mouth. 2-3x daily  0  . predniSONE (DELTASONE) 10 MG tablet Take 3 tablets (30 mg total) by mouth daily with breakfast. 15 tablet 0   No current facility-administered medications for this visit.    Allergies  Allergen Reactions  . Belviq [Lorcaserin Hcl]     Emotional feelings.   . Morphine   . Moxifloxacin     REACTION: itching and swelling of face  . Wellbutrin [Bupropion]     insomnia    Health Maintenance Health Maintenance  Topic Date Due  . HIV Screening  01/23/1987  . TETANUS/TDAP  01/23/1991  . PAP SMEAR  01/22/1993  . INFLUENZA VACCINE  03/08/2018 (Originally 11/21/2016)     Exam:  BP 131/81   Pulse 90   Ht 5\' 8"  (1.727 m)   Wt 206 lb (93.4 kg)   BMI 31.32 kg/m  Gen: Well NAD HEENT: EOMI,  MMM tympanic membranes are normal-appearing bilaterally without effusion.  Clear nasal discharge with mildly inflamed nasal turbinates.  Posterior pharynx with mild cobblestoning.  No cervical lymphadenopathy. Lungs: Normal work of breathing. CTABL Heart: RRR no MRG Abd: NABS, Soft. Nondistended, Nontender Exts: Brisk capillary refill, warm and well perfused.  Neuro: Alert and oriented normal  coordination balance and gait.  Dix-Hallpike test is negative bilaterally.     Assessment and Plan: 47 y.o. female with  Ear pressure: Very likely eustachian tube dysfunction.  At this point she is close to having maximized her medical therapy.  Recommend a good 1 month long trial of Flonase nasal spray and nonsedating antihistamine daily.  Plan for short course of oral steroids.  Referral to ENT placed.  At that point if she still symptomatic it may be reasonable for further evaluation.   Dizziness: Etiology is unclear.  Her symptoms seem to be quite vague.  Certainly vertigo possibly BPPV is in the differential but it is not obvious if that is the case.  Regardless it is reasonable to proceed with a workup listed below and a trial of vestibular physical therapy if not better.  He notes that she lives in [place] and works in [employer] and notes that would be very hard for her to attend physical therapy appointments for vestibular rehab.  Nausea: Again this is a vague complaint with a unclear differential.  The nausea could be related to dizziness however he does not always seem to be directly correlated her history.  Her physical exam is benign.  Will complete workup listed below have a trial of Zofran as needed.   Orders Placed This Encounter  Procedures  . Tdap vaccine greater than or equal to 7yo IM  . CBC  . COMPLETE METABOLIC PANEL WITH GFR  . Vitamin B12  . VITAMIN D 25 Hydroxy (Vit-D Deficiency, Fractures)  . TSH  . Ambulatory referral to ENT    Referral Priority:   Routine    Referral Type:   Consultation    Referral Reason:   Specialty Services Required    Requested Specialty:   Otolaryngology    Number of Visits Requested:   1   Meds ordered this encounter  Medications  . ondansetron (ZOFRAN) 4 MG tablet    Sig: Take 1 tablet (4 mg total) by mouth every 8 (eight) hours as needed for nausea or vomiting.    Dispense:  30 tablet    Refill:  1  . predniSONE (DELTASONE)  10 MG tablet    Sig: Take 3 tablets (30 mg total) by mouth daily with breakfast.    Dispense:  15 tablet    Refill:  0     Discussed warning signs or symptoms. Please see discharge instructions. Patient expresses understanding.

## 2017-07-12 LAB — COMPLETE METABOLIC PANEL WITH GFR
AG RATIO: 2.1 (calc) (ref 1.0–2.5)
ALBUMIN MSPROF: 4.7 g/dL (ref 3.6–5.1)
ALT: 38 U/L — AB (ref 6–29)
AST: 28 U/L (ref 10–35)
Alkaline phosphatase (APISO): 76 U/L (ref 33–115)
BILIRUBIN TOTAL: 0.5 mg/dL (ref 0.2–1.2)
BUN: 15 mg/dL (ref 7–25)
CALCIUM: 9.8 mg/dL (ref 8.6–10.2)
CHLORIDE: 103 mmol/L (ref 98–110)
CO2: 27 mmol/L (ref 20–32)
Creat: 0.72 mg/dL (ref 0.50–1.10)
GFR, EST AFRICAN AMERICAN: 117 mL/min/{1.73_m2} (ref >=60)
GFR, EST NON AFRICAN AMERICAN: 101 mL/min/{1.73_m2} (ref >=60)
GLOBULIN: 2.2 g/dL (ref 1.9–3.7)
Glucose, Bld: 84 mg/dL (ref 65–99)
POTASSIUM: 4.4 mmol/L (ref 3.5–5.3)
SODIUM: 137 mmol/L (ref 135–146)
TOTAL PROTEIN: 6.9 g/dL (ref 6.1–8.1)

## 2017-07-12 LAB — CBC
HEMATOCRIT: 38.2 % (ref 35.0–45.0)
Hemoglobin: 13.2 g/dL (ref 11.7–15.5)
MCH: 30.1 pg (ref 27.0–33.0)
MCHC: 34.6 g/dL (ref 32.0–36.0)
MCV: 87 fL (ref 80.0–100.0)
MPV: 11.4 fL (ref 7.5–12.5)
PLATELETS: 251 10*3/uL (ref 140–400)
RBC: 4.39 10*6/uL (ref 3.80–5.10)
RDW: 12.6 % (ref 11.0–15.0)
WBC: 6.5 10*3/uL (ref 3.8–10.8)

## 2017-07-12 LAB — VITAMIN B12: Vitamin B-12: 279 pg/mL (ref 200–1100)

## 2017-07-12 LAB — VITAMIN D 25 HYDROXY (VIT D DEFICIENCY, FRACTURES): Vit D, 25-Hydroxy: 33 ng/mL (ref 30–100)

## 2017-07-12 LAB — TSH: TSH: 0.88 mIU/L

## 2017-07-28 ENCOUNTER — Other Ambulatory Visit: Payer: Self-pay | Admitting: Physician Assistant

## 2017-07-28 DIAGNOSIS — F331 Major depressive disorder, recurrent, moderate: Secondary | ICD-10-CM

## 2017-07-30 ENCOUNTER — Other Ambulatory Visit: Payer: Self-pay | Admitting: *Deleted

## 2017-08-05 ENCOUNTER — Other Ambulatory Visit: Payer: Self-pay | Admitting: Physician Assistant

## 2017-08-05 ENCOUNTER — Encounter: Payer: Self-pay | Admitting: Physician Assistant

## 2017-08-05 DIAGNOSIS — F988 Other specified behavioral and emotional disorders with onset usually occurring in childhood and adolescence: Secondary | ICD-10-CM

## 2017-08-05 MED ORDER — AMPHETAMINE-DEXTROAMPHETAMINE 20 MG PO TABS: 20.0000 mg | ORAL_TABLET | Freq: Two times a day (BID) | ORAL | 0 refills | Status: DC

## 2017-08-05 NOTE — Telephone Encounter (Signed)
Requesting Adderall RF be sent to [facility].  Please review and send if appropriate.  Thanks!

## 2017-09-05 ENCOUNTER — Other Ambulatory Visit: Payer: Self-pay | Admitting: Physician Assistant

## 2017-09-05 DIAGNOSIS — F988 Other specified behavioral and emotional disorders with onset usually occurring in childhood and adolescence: Secondary | ICD-10-CM

## 2017-09-05 MED ORDER — AMPHETAMINE-DEXTROAMPHETAMINE 20 MG PO TABS: 20.0000 mg | ORAL_TABLET | Freq: Two times a day (BID) | ORAL | 0 refills | Status: DC

## 2017-10-04 ENCOUNTER — Other Ambulatory Visit: Payer: Self-pay | Admitting: Physician Assistant

## 2017-10-04 DIAGNOSIS — F988 Other specified behavioral and emotional disorders with onset usually occurring in childhood and adolescence: Secondary | ICD-10-CM

## 2017-10-04 MED ORDER — AMPHETAMINE-DEXTROAMPHETAMINE 20 MG PO TABS: 20.0000 mg | ORAL_TABLET | Freq: Two times a day (BID) | ORAL | 0 refills | Status: DC

## 2017-10-21 ENCOUNTER — Encounter: Payer: Self-pay | Admitting: Physician Assistant

## 2017-10-21 ENCOUNTER — Other Ambulatory Visit: Payer: Self-pay | Admitting: Physician Assistant

## 2017-10-23 MED ORDER — AMITRIPTYLINE HCL 50 MG PO TABS: 50.0000 mg | ORAL_TABLET | Freq: Every day | ORAL | 1 refills | Status: DC

## 2017-11-04 ENCOUNTER — Other Ambulatory Visit: Payer: Self-pay | Admitting: Physician Assistant

## 2017-11-04 DIAGNOSIS — F988 Other specified behavioral and emotional disorders with onset usually occurring in childhood and adolescence: Secondary | ICD-10-CM

## 2017-11-04 MED ORDER — AMPHETAMINE-DEXTROAMPHETAMINE 20 MG PO TABS: 20.0000 mg | ORAL_TABLET | Freq: Two times a day (BID) | ORAL | 0 refills | Status: DC

## 2017-11-04 NOTE — Telephone Encounter (Signed)
Requesting RF on Adderall   Last RX written 10-04-17  RX pended, please send if appropriate.  Thanks!

## 2017-12-04 ENCOUNTER — Other Ambulatory Visit: Payer: Self-pay | Admitting: Physician Assistant

## 2017-12-04 DIAGNOSIS — F988 Other specified behavioral and emotional disorders with onset usually occurring in childhood and adolescence: Secondary | ICD-10-CM

## 2017-12-05 ENCOUNTER — Other Ambulatory Visit: Payer: Self-pay | Admitting: Physician Assistant

## 2017-12-05 ENCOUNTER — Encounter: Payer: Self-pay | Admitting: Physician Assistant

## 2017-12-05 DIAGNOSIS — F988 Other specified behavioral and emotional disorders with onset usually occurring in childhood and adolescence: Secondary | ICD-10-CM

## 2017-12-06 ENCOUNTER — Other Ambulatory Visit: Payer: Self-pay | Admitting: Physician Assistant

## 2017-12-06 DIAGNOSIS — F988 Other specified behavioral and emotional disorders with onset usually occurring in childhood and adolescence: Secondary | ICD-10-CM

## 2017-12-09 MED ORDER — AMPHETAMINE-DEXTROAMPHETAMINE 20 MG PO TABS: 20.0000 mg | ORAL_TABLET | Freq: Two times a day (BID) | ORAL | 0 refills | Status: DC

## 2017-12-09 NOTE — Telephone Encounter (Signed)
Rx sent for Adderal.

## 2018-01-06 ENCOUNTER — Other Ambulatory Visit: Payer: Self-pay | Admitting: Family Medicine

## 2018-01-06 DIAGNOSIS — F988 Other specified behavioral and emotional disorders with onset usually occurring in childhood and adolescence: Secondary | ICD-10-CM

## 2018-01-07 MED ORDER — AMPHETAMINE-DEXTROAMPHETAMINE 20 MG PO TABS: 20.0000 mg | ORAL_TABLET | Freq: Two times a day (BID) | ORAL | 0 refills | Status: DC

## 2018-02-06 ENCOUNTER — Other Ambulatory Visit: Payer: Self-pay

## 2018-02-06 DIAGNOSIS — F988 Other specified behavioral and emotional disorders with onset usually occurring in childhood and adolescence: Secondary | ICD-10-CM

## 2018-02-06 MED ORDER — AMPHETAMINE-DEXTROAMPHETAMINE 20 MG PO TABS: 20.0000 mg | ORAL_TABLET | Freq: Two times a day (BID) | ORAL | 0 refills | Status: DC

## 2018-02-06 NOTE — Telephone Encounter (Signed)
Stacey Alvarado called today. She is one of Stacey Alvarado's patients. She usually sees Stacey Alvarado for her refills on Adderall. She called today in need of another refill on her Adderall. I was looking back at her chart and saw that the last time she was here to see Stacey Alvarado for a refill was on 05/31/2017. I just wanted to see if we should refill it long enough until she can make an appointment or just wait? I have pended the medication. Thanks!

## 2018-02-06 NOTE — Telephone Encounter (Signed)
Looks like at that visit Stacey Alvarado wanted this person to follow-up in 2 months but it looks like she sent a refill last month for the ADD medications.  I am okay to refill a 30-day supply but sometime in that month she will need to come see [facility] for follow-up.  We will route the note to [facility] as FYI, follow her instruction if she has different preference

## 2018-03-05 ENCOUNTER — Other Ambulatory Visit: Payer: Self-pay

## 2018-03-05 DIAGNOSIS — F988 Other specified behavioral and emotional disorders with onset usually occurring in childhood and adolescence: Secondary | ICD-10-CM

## 2018-03-05 MED ORDER — AMPHETAMINE-DEXTROAMPHETAMINE 20 MG PO TABS: 20.0000 mg | ORAL_TABLET | Freq: Two times a day (BID) | ORAL | 0 refills | Status: DC

## 2018-03-05 NOTE — Telephone Encounter (Signed)
[name] called today in need of a refill on her Adderall. I have pended the medication, but just wanted to let you know. Thanks!

## 2018-04-01 ENCOUNTER — Other Ambulatory Visit: Payer: Self-pay

## 2018-04-01 DIAGNOSIS — F988 Other specified behavioral and emotional disorders with onset usually occurring in childhood and adolescence: Secondary | ICD-10-CM

## 2018-04-01 MED ORDER — AMPHETAMINE-DEXTROAMPHETAMINE 20 MG PO TABS: 20.0000 mg | ORAL_TABLET | Freq: Two times a day (BID) | ORAL | 0 refills | Status: DC

## 2018-04-01 NOTE — Telephone Encounter (Signed)
Will approve #[phone]. That will give her 2 weeks [facility]

## 2018-04-01 NOTE — Telephone Encounter (Signed)
[name] is one of Stacey Alvarado's patients. She called today in need of another refill on her Adderall. I see that she is actually due for a follow-up visit with [facility]ly [facility], but since she is out of the office, I wanted to ask you to see if we could still refill it? I have pended the medication if you want to refill it. Thanks so much!

## 2018-04-07 ENCOUNTER — Telehealth: Payer: Self-pay

## 2018-04-07 NOTE — Telephone Encounter (Signed)
Patient called today saying that she picked up her most recent prescription for Adderall that was called out on 04/01/2018. Patient said that she is due for a follow-up appointment, but she will not be able to come in for that follow-up appointment until the end of February due to her starting a new job, and she is currently training and the training will not allow her to take any time off until the end of February. She wanted me to ask you and see if she could have the Adderall refilled when the time comes, or what she should do about the follow-up appointment. Patient also states she wanted a refill on her Zoloft. I checked her medication list, and Zoloft is not on the medication list. I called her back and left a message to have her call us back with those details, but I just wanted to let you know. Thanks!

## 2018-04-09 NOTE — Telephone Encounter (Signed)
Left pt msg asking her to call back and let us know what dose Zoloft she is taking, if she is taking any at all

## 2018-04-09 NOTE — Telephone Encounter (Signed)
Yes ok to refill adderall until then. Ok to restart zoloft. Confirm dosage she may need to titrate back up if not taking at all.

## 2018-04-09 NOTE — Telephone Encounter (Signed)
Pt called again inquiring about Adderall and Zoloft refills. Please advise if she can continue to get refills monthly even though she cannot make a follow up appt until March, due to work.

## 2018-04-11 ENCOUNTER — Other Ambulatory Visit: Payer: Self-pay

## 2018-04-11 ENCOUNTER — Telehealth: Payer: Self-pay

## 2018-04-11 MED ORDER — SERTRALINE HCL 50 MG PO TABS: 50.0000 mg | ORAL_TABLET | Freq: Every day | ORAL | 1 refills | Status: DC

## 2018-04-11 NOTE — Telephone Encounter (Signed)
Patient called back today and confirmed she was taking 50 mg of Zoloft daily. I spoke with provider, and she refilled Zoloft with enough refills until patient can come in for her appointment in March. I called and left a voicemail for patient with medication refill information. I also provided the office's contact information if any further questions or concerns.

## 2018-04-14 NOTE — Telephone Encounter (Signed)
See next telephone encounter 

## 2018-04-17 ENCOUNTER — Other Ambulatory Visit: Payer: Self-pay

## 2018-04-17 DIAGNOSIS — F988 Other specified behavioral and emotional disorders with onset usually occurring in childhood and adolescence: Secondary | ICD-10-CM

## 2018-04-17 NOTE — Telephone Encounter (Signed)
Patient called today saying she needs a refill on her Adderall. I have pended the medication for refill if appropriate. Thanks!

## 2018-04-17 NOTE — Telephone Encounter (Signed)
I am more than willing to fill until she can get to her appt but this was filled 2 weeks ago. Can we call pharmacy and see if she picked it up?

## 2018-04-18 NOTE — Telephone Encounter (Signed)
Called and left a voicemail for patient. I saw that the last refill was for 30 tablets, but she takes 2 tablets per day so maybe that is why it is in need of a refill so early?

## 2018-04-18 NOTE — Telephone Encounter (Signed)
She was given a partial refill that is why she was only given 30 tabs enough time to make an appointment.  Has not been seen since February of this past year for ADD medication being that it is a scheduled drug she will have to make an appointment and be seen before any further refills will be given.

## 2018-04-21 NOTE — Telephone Encounter (Signed)
Last rx was only a 15 day supply. PCP aware patient cannot come into office until February when she gets insurance, routing to PCP for recommendation.

## 2018-04-22 NOTE — Telephone Encounter (Signed)
Per office protocol has to have office visit within at least 6 months.

## 2018-04-22 NOTE — Telephone Encounter (Signed)
Called and notified patient     Thank you

## 2018-05-29 ENCOUNTER — Encounter: Payer: Self-pay | Admitting: Osteopathic Medicine

## 2018-05-29 ENCOUNTER — Ambulatory Visit (INDEPENDENT_AMBULATORY_CARE_PROVIDER_SITE_OTHER): Payer: 59 | Admitting: Osteopathic Medicine

## 2018-05-29 VITALS — BP 127/71 | HR 74 | Temp 98.3°F | Wt 208.9 lb

## 2018-05-29 DIAGNOSIS — R05 Cough: Secondary | ICD-10-CM

## 2018-05-29 DIAGNOSIS — J410 Simple chronic bronchitis: Secondary | ICD-10-CM

## 2018-05-29 DIAGNOSIS — R058 Other specified cough: Secondary | ICD-10-CM

## 2018-05-29 MED ORDER — AZITHROMYCIN 250 MG PO TABS: ORAL_TABLET | ORAL | 0 refills | Status: DC

## 2018-05-29 MED ORDER — GUAIFENESIN-CODEINE 100-10 MG/5ML PO SYRP: 5.0000 mL | ORAL_SOLUTION | Freq: Three times a day (TID) | ORAL | 0 refills | Status: DC | PRN

## 2018-05-29 MED ORDER — PREDNISONE 20 MG PO TABS: 20.0000 mg | ORAL_TABLET | Freq: Two times a day (BID) | ORAL | 0 refills | Status: DC

## 2018-05-29 NOTE — Progress Notes (Signed)
HPI: Stacey Alvarado is a 48 y.o. female who  has no past medical history on file.  she presents to [facility] today, 05/29/18,  for chief complaint of: Sick   Fever started last week, now only has cough, taking Mucinex and this is not really helping. Smoker. No associated sinus pain, sore throat, ear pain.  Immunization History  Administered Date(s) Administered  . Tdap 07/11/2017        Past medical, surgical, social and family history reviewed and updated as necessary.   Current medication list and allergy/intolerance information reviewed  Current Meds  Medication Sig  . amitriptyline (ELAVIL) 50 MG tablet Take 1 tablet (50 mg total) by mouth at bedtime.  Marland Kitchen amphetamine-dextroamphetamine (ADDERALL) 20 MG tablet Take 1 tablet (20 mg total) by mouth 2 (two) times daily. DUE FOR FOLLOW-UP  . ondansetron (ZOFRAN) 4 MG tablet Take 1 tablet (4 mg total) by mouth every 8 (eight) hours as needed for nausea or vomiting.  Marland Kitchen oxyCODONE (ROXICODONE) 15 MG immediate release tablet Take 15 mg by mouth. 2-3x daily  . sertraline (ZOLOFT) 50 MG tablet Take 1 tablet (50 mg total) by mouth daily.      Allergies  Allergen Reactions  . Belviq [Lorcaserin Hcl]     Emotional feelings.   . Morphine   . Moxifloxacin Other (See Comments)    REACTION: itching and swelling of face Angioedema and Pruritis  . Trintellix [Vortioxetine]     Made mood worse  . Wellbutrin [Bupropion]     insomnia  . Propoxyphene Nausea Only      Review of Systems:  Constitutional:  No  fever, no chills, +recent illness, No unintentional weight changes. +significant fatigue.   HEENT: No  headache, no vision change, no hearing change, No sore throat, No  sinus pressure  Cardiac: No  chest pain, No  pressure, No palpitations  Respiratory:  No  shortness of breath. +Cough  Gastrointestinal: No  abdominal pain  Musculoskeletal: No new myalgia/arthralgia  Skin: No   Rash  Neurologic: No  weakness, No  dizziness  Exam:  BP 127/71 (BP Location: Left Arm, Patient Position: Sitting, Cuff Size: Normal)   Pulse 74   Temp 98.3 F (36.8 C) (Oral)   Wt 208 lb 14.4 oz (94.8 kg)   SpO2 98%   BMI 31.76 kg/m   Constitutional: VS see above. General Appearance: alert, well-developed, well-nourished, NAD  Eyes: Normal lids and conjunctive, non-icteric sclera  Ears, Nose, Mouth, Throat: MMM, Normal external inspection ears/nares/mouth/lips/gums. TM normal bilaterally. Pharynx/tonsils no erythema, no exudate. Nasal mucosa normal.   Neck: No masses, trachea midline. No tenderness/mass appreciated. No lymphadenopathy  Respiratory: Normal respiratory effort. no wheeze, no rhonchi, no rales  Cardiovascular: S1/S2 normal, no murmur, no rub/gallop auscultated. RRR. No lower extremity edema.   Musculoskeletal: Gait normal.   Neurological: Normal balance/coordination. No tremor.   Skin: warm, dry, intact.   Psychiatric: Normal judgment/insight. Normal mood and affect.        ASSESSMENT/PLAN: The primary encounter diagnosis was Post-viral cough syndrome. A diagnosis of Smokers' cough (HCC) was also pertinent to this visit.   Meds ordered this encounter  Medications  . predniSONE (DELTASONE) 20 MG tablet    Sig: Take 1 tablet (20 mg total) by mouth 2 (two) times daily with a meal.    Dispense:  10 tablet    Refill:  0  . DISCONTD: guaiFENesin-codeine (ROBITUSSIN AC) 100-10 MG/5ML syrup    Sig: Take 5-10  mLs by mouth 3 (three) times daily as needed for cough.    Dispense:  118 mL    Refill:  0  . azithromycin (ZITHROMAX) 250 MG tablet    Sig: 2 tabs po on Day 1, then 1 tab daily Days 2 - 5. Fill Rx if no better by 06/02/2018. Expires 06/09/2018    Dispense:  6 tablet    Refill:  0    Expires 06/09/2018  . guaiFENesin-codeine (ROBITUSSIN AC) 100-10 MG/5ML syrup    Sig: Take 5-10 mLs by mouth 3 (three) times daily as needed for cough.    Dispense:   118 mL    Refill:  0     Patient Instructions  Medications & Home Remedies for Upper Respiratory Illness   Note: the following list assumes no pregnancy, normal liver & kidney function and no other drug interactions. Dr. Lyn Hollingshead has highlighted medications which are safe for you to use, but these may not be appropriate for everyone. Always ask a pharmacist or qualified medical provider if you have any questions!    Aches/Pains, Fever, Headache OTC Acetaminophen (Tylenol) 500 mg tablets - take max 2 tablets (1000 mg) every 6 hours (4 times per day)  OTC Ibuprofen (Motrin) 200 mg tablets - take max 4 tablets (800 mg) every 6 hours*   Sinus Congestion OTC Nasal Saline if desired to rinse OTC Oxymetolazone (Afrin, others) sparing use due to rebound congestion, NEVER use in kids OTC Phenylephrine (Sudafed) 10 mg tablets every 4 hours (or the 12-hour formulation) OTC Diphenhydramine (Benadryl) 25 mg tablets - take max 2 tablets every 4 hours   Cough & Sore Throat Prescription cough pills or syrups as directed OTC Dextromethorphan (Robitussin, others) - cough suppressant OTC Lozenges w/ Benzocaine + Menthol (Cepacol) Honey - as much as you want! Teas which "coat the throat" - look for ingredients Elm Bark, Licorice Root, Marshmallow Root   Other Prescription Oral Steroids to decrease inflammation and improve energy Prescription Antibiotics if these are necessary for bacterial infection - take ALL, even if you're feeling better  OTC Zinc Lozenges within 24 hours of symptoms onset - mixed evidence this shortens the duration of the common cold Don't waste your money on Vitamin C or Echinacea in acute illness - it's already too late!            Visit summary with medication list and pertinent instructions was printed for patient to review. All questions at time of visit were answered - patient instructed to contact office with any additional concerns or updates. ER/RTC precautions were  reviewed with the patient.   Follow-up plan: Return if symptoms worsen or fail to improve.   Please note: voice recognition software was used to produce this document, and typos may escape review. Please contact Dr. Lyn Hollingshead for any needed clarifications.

## 2018-05-29 NOTE — Patient Instructions (Signed)
Medications & Home Remedies for Upper Respiratory Illness   Note: the following list assumes no pregnancy, normal liver & kidney function and no other drug interactions. Dr. Lyn Hollingshead has highlighted medications which are safe for you to use, but these may not be appropriate for everyone. Always ask a pharmacist or qualified medical provider if you have any questions!    Aches/Pains, Fever, Headache OTC Acetaminophen (Tylenol) 500 mg tablets - take max 2 tablets (1000 mg) every 6 hours (4 times per day)  OTC Ibuprofen (Motrin) 200 mg tablets - take max 4 tablets (800 mg) every 6 hours*   Sinus Congestion OTC Nasal Saline if desired to rinse OTC Oxymetolazone (Afrin, others) sparing use due to rebound congestion, NEVER use in kids OTC Phenylephrine (Sudafed) 10 mg tablets every 4 hours (or the 12-hour formulation) OTC Diphenhydramine (Benadryl) 25 mg tablets - take max 2 tablets every 4 hours   Cough & Sore Throat Prescription cough pills or syrups as directed OTC Dextromethorphan (Robitussin, others) - cough suppressant OTC Lozenges w/ Benzocaine + Menthol (Cepacol) Honey - as much as you want! Teas which "coat the throat" - look for ingredients Elm Bark, Licorice Root, Marshmallow Root   Other Prescription Oral Steroids to decrease inflammation and improve energy Prescription Antibiotics if these are necessary for bacterial infection - take ALL, even if you're feeling better  OTC Zinc Lozenges within 24 hours of symptoms onset - mixed evidence this shortens the duration of the common cold Don't waste your money on Vitamin C or Echinacea in acute illness - it's already too late!

## 2018-11-28 ENCOUNTER — Ambulatory Visit (INDEPENDENT_AMBULATORY_CARE_PROVIDER_SITE_OTHER): Payer: Self-pay | Admitting: Osteopathic Medicine

## 2018-11-28 ENCOUNTER — Encounter: Payer: Self-pay | Admitting: Osteopathic Medicine

## 2018-11-28 ENCOUNTER — Other Ambulatory Visit: Payer: Self-pay

## 2018-11-28 VITALS — Temp 99.0°F | Wt 200.0 lb

## 2018-11-28 DIAGNOSIS — R6889 Other general symptoms and signs: Secondary | ICD-10-CM

## 2018-11-28 DIAGNOSIS — R51 Headache: Secondary | ICD-10-CM

## 2018-11-28 DIAGNOSIS — J029 Acute pharyngitis, unspecified: Secondary | ICD-10-CM

## 2018-11-28 DIAGNOSIS — R519 Headache, unspecified: Secondary | ICD-10-CM

## 2018-11-28 DIAGNOSIS — R0981 Nasal congestion: Secondary | ICD-10-CM

## 2018-11-28 DIAGNOSIS — Z20822 Contact with and (suspected) exposure to covid-19: Secondary | ICD-10-CM

## 2018-11-28 MED ORDER — IPRATROPIUM BROMIDE 0.06 % NA SOLN: 2.0000 | Freq: Three times a day (TID) | NASAL | 0 refills | Status: DC

## 2018-11-28 MED ORDER — CLINDAMYCIN HCL 300 MG PO CAPS: 300.0000 mg | ORAL_CAPSULE | Freq: Three times a day (TID) | ORAL | 0 refills | Status: DC

## 2018-11-28 NOTE — Progress Notes (Signed)
Virtual Visit via  Video (App used: doximity) Note  I connected with      Stacey Alvarado on 11/28/18 at 12:30 PM by a telemedicine application and verified that I am speaking with the correct person using two identifiers.  Patient is at [facility] I am in office   I discussed the limitations of evaluation and management by telemedicine and the availability of in person appointments. The patient expressed understanding and agreed to proceed.  History of Present Illness: Stacey Alvarado is a 48 y.o. female who would like to discuss feeling sick  Sore throat and sinus congestion w/ frontal headache x3 weeks/ Had some leftover Augmentin x6 days which made no difference/ Temp to 99 but never higher than that. No cough or SOB. No difficulty swallowing or neck pain w/ motion.        Observations/Objective: Temp 99 F (37.2 C) (Oral)   Wt 200 lb (90.7 kg)   BMI 30.41 kg/m  BP Readings from Last 3 Encounters:  05/29/18 127/71  07/11/17 131/81  05/31/17 132/76   Exam: Normal Speech.  NAD  Lab and Radiology Results No results found for this or any previous visit (from the past 72 hour(s)). No results found.     Assessment and Plan: 48 y.o. female with The primary encounter diagnosis was Suspected Covid-19 Virus Infection. Diagnoses of Sore throat, Sinus headache, and Sinus congestion were also pertinent to this visit.  COVID seems unlikely but worth testing for. Given duration without worsening, don't have strong suspicion for absces, posibly persistent sinusitis / tonsillitis. Trial alternative abx and add nasal spray to reduce possible postnasal drip, continue ibuprofen, add antihistamine such as claritin or similar.   PDMP not reviewed this encounter. Orders Placed This Encounter  Procedures  . Novel Coronavirus, NAA ([facility])    Order Specific Question:   Is this test for diagnosis or screening    Answer:   Diagnosis of ill patient    Order Specific Question:    Symptomatic for COVID-19 as defined by CDC    Answer:   Yes    Order Specific Question:   Date of Symptom Onset    Answer:   11/07/2018    Order Specific Question:   Hospitalized for COVID-19    Answer:   No    Order Specific Question:   Admitted to ICU for COVID-19    Answer:   No    Order Specific Question:   Previously tested for COVID-19    Answer:   No    Order Specific Question:   Resident in a congregate (group) care setting    Answer:   No    Order Specific Question:   Employed in healthcare setting    Answer:   No    Order Specific Question:   Pregnant    Answer:   No   Meds ordered this encounter  Medications  . clindamycin (CLEOCIN) 300 MG capsule    Sig: Take 1 capsule (300 mg total) by mouth 3 (three) times daily.    Dispense:  21 capsule    Refill:  0  . ipratropium (ATROVENT) 0.06 % nasal spray    Sig: Place 2 sprays into both nostrils 3 (three) times daily.    Dispense:  15 mL    Refill:  0     Follow Up Instructions: Return if symptoms worsen or fail to improve.    I discussed the assessment and treatment plan with the patient. The  patient was provided an opportunity to ask questions and all were answered. The patient agreed with the plan and demonstrated an understanding of the instructions.   The patient was advised to call back or seek an in-person evaluation if any new concerns, if symptoms worsen or if the condition fails to improve as anticipated.  25 minutes of non-face-to-face time was provided during this encounter.                      Historical information moved to improve visibility of documentation.  No past medical history on file. Past Surgical History:  Procedure Laterality Date  . ABDOMINAL HYSTERECTOMY    . CARPAL TUNNEL RELEASE    . CHOLECYSTECTOMY    . TONSILLECTOMY     Social History   Tobacco Use  . Smoking status: Current Every Day Smoker    Types: Cigarettes  . Smokeless tobacco: Never Used  . Tobacco  comment: vapor cigs  Substance Use Topics  . Alcohol use: No   family history is not on file.  Medications: Current Outpatient Medications  Medication Sig Dispense Refill  . oxyCODONE (ROXICODONE) 15 MG immediate release tablet Take 15 mg by mouth. 2-3x daily  0  . sertraline (ZOLOFT) 50 MG tablet Take 1 tablet (50 mg total) by mouth daily. 90 tablet 1  . amitriptyline (ELAVIL) 50 MG tablet Take 1 tablet (50 mg total) by mouth at bedtime. (Patient not taking: Reported on 11/28/2018) 30 tablet 1  . amphetamine-dextroamphetamine (ADDERALL) 20 MG tablet Take 1 tablet (20 mg total) by mouth 2 (two) times daily. DUE FOR FOLLOW-UP (Patient not taking: Reported on 11/28/2018) 30 tablet 0  . azithromycin (ZITHROMAX) 250 MG tablet 2 tabs po on Day 1, then 1 tab daily Days 2 - 5. Fill Rx if no better by 06/02/2018. Expires 06/09/2018 (Patient not taking: Reported on 11/28/2018) 6 tablet 0  . guaiFENesin-codeine (ROBITUSSIN AC) 100-10 MG/5ML syrup Take 5-10 mLs by mouth 3 (three) times daily as needed for cough. (Patient not taking: Reported on 11/28/2018) 118 mL 0  . ondansetron (ZOFRAN) 4 MG tablet Take 1 tablet (4 mg total) by mouth every 8 (eight) hours as needed for nausea or vomiting. (Patient not taking: Reported on 11/28/2018) 30 tablet 1  . predniSONE (DELTASONE) 20 MG tablet Take 1 tablet (20 mg total) by mouth 2 (two) times daily with a meal. (Patient not taking: Reported on 11/28/2018) 10 tablet 0   No current facility-administered medications for this visit.    Allergies  Allergen Reactions  . Belviq [Lorcaserin Hcl]     Emotional feelings.   . Morphine   . Moxifloxacin Other (See Comments)    REACTION: itching and swelling of face Angioedema and Pruritis  . Trintellix [Vortioxetine]     Made mood worse  . Wellbutrin [Bupropion]     insomnia  . Propoxyphene Nausea Only    PDMP not reviewed this encounter. No orders of the defined types were placed in this encounter.  No orders of the  defined types were placed in this encounter.

## 2018-11-28 NOTE — Progress Notes (Signed)
Called pt at 1130 am & 1140 am, went straight to vm. Left a vm msg.

## 2019-01-09 ENCOUNTER — Other Ambulatory Visit: Payer: Self-pay | Admitting: Physician Assistant

## 2019-01-15 ENCOUNTER — Ambulatory Visit (INDEPENDENT_AMBULATORY_CARE_PROVIDER_SITE_OTHER): Payer: Self-pay | Admitting: Physician Assistant

## 2019-01-15 VITALS — Temp 98.2°F | Ht 68.0 in | Wt 200.0 lb

## 2019-01-15 DIAGNOSIS — F331 Major depressive disorder, recurrent, moderate: Secondary | ICD-10-CM

## 2019-01-15 DIAGNOSIS — F988 Other specified behavioral and emotional disorders with onset usually occurring in childhood and adolescence: Secondary | ICD-10-CM

## 2019-01-15 MED ORDER — LAMOTRIGINE 25 MG PO TABS: 25.0000 mg | ORAL_TABLET | Freq: Every day | ORAL | 2 refills | Status: DC

## 2019-01-15 MED ORDER — SERTRALINE HCL 100 MG PO TABS: 100.0000 mg | ORAL_TABLET | Freq: Every day | ORAL | 2 refills | Status: DC

## 2019-01-15 MED ORDER — AMPHETAMINE-DEXTROAMPHETAMINE 20 MG PO TABS: 20.0000 mg | ORAL_TABLET | Freq: Two times a day (BID) | ORAL | 0 refills | Status: DC

## 2019-01-15 NOTE — Progress Notes (Signed)
Patient ID: Stacey Alvarado, female   DOB: [birth year], 48 y.o.   MRN: [id number] .Marland KitchenVirtual Visit via Video Note  I connected with Stacey Alvarado on 01/19/19 at  2:40 PM EDT by a video enabled telemedicine application and verified that I am speaking with the correct person using two identifiers.  Location: Patient: home Provider: clinic   I discussed the limitations of evaluation and management by telemedicine and the availability of in person appointments. The patient expressed understanding and agreed to proceed.  History of Present Illness: Pt is a 48 yo female with MDD and ADD who calls into the clinic for follow up.   She is doing "ok" today. She continues to have no motivation to do anything. She has been out of adderall and not scheduled followed up. She denies any major anxiety. She continues to go to work. No SI/HC. We have tried numerous medications in the past. zoloft works the best.   Her focus has been off but not had adderall. Needs refills today. No problems sleeping or increase in anxiety.   .. Active Ambulatory Problems    Diagnosis Date Noted  . Hypothyroidism 09/02/2007  . HYPERLIPIDEMIA 09/02/2007  . TOBACCO ABUSE 09/02/2007  . Depression 09/02/2007  . BACK PAIN, UPPER 05/30/2009  . DYSFUNCTION OF EUSTACHIAN TUBE 05/25/2010  . HAND PAIN 09/27/2010  . ADD (attention deficit disorder) 06/26/2013  . Cervical radiculitis 06/26/2013  . Multiple joint pain 07/07/2013  . 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 Vitamin D deficiency 05/07/2014  . Class 1 obesity due to excess calories without serious comorbidity in adult 02/14/2016  . No energy 08/13/2016  . B12 deficiency 08/14/2016  . Single skin nodule 12/10/2016  . Left hand paresthesia 12/10/2016   Resolved Ambulatory Problems    Diagnosis Date Noted  . LOM 12/16/2007  . BRONCHITIS, ACUTE 01/08/2008  . URI 05/25/2010  . Essential hypertension 01/17/2015   No Additional Past Medical  History   Reviewed med, allergy, problem list.     Observations/Objective: No acute distress. Continues to have flat affect.   .. Today's Vitals   01/15/19 1107  Temp: 98.2 F (36.8 C)  Weight: 200 lb (90.7 kg)  Height: 5\' 8"  (1.727 m)   Body mass index is 30.41 kg/m.  .. Depression screen [facility] 2/9 05/31/2017 03/08/2017 12/05/2016 08/13/2016  Decreased Interest 3 3 3 2   Down, Depressed, Hopeless 2 2 3 2   PHQ - 2 Score 5 5 6 4   Altered sleeping 1 1 0 0  Tired, decreased energy 2 2 2 1   Change in appetite 2 3 3 2   Feeling bad or failure about yourself  2 2 2 2   Trouble concentrating 1 1 2 1   Moving slowly or fidgety/restless 1 0 1 1  Suicidal thoughts 0 0 0 0  PHQ-9 Score 14 14 16 11   Difficult doing work/chores Very difficult Somewhat difficult - -   .Marland Kitchen GAD 7 : Generalized Anxiety Score 05/31/2017 03/08/2017  Nervous, Anxious, on Edge 1 1  Control/stop worrying 1 1  Worry too much - different things 1 1  Trouble relaxing 1 1  Restless 1 0  Easily annoyed or irritable 2 3  Afraid - awful might happen 0 1  Total GAD 7 Score 7 8  Anxiety Difficulty Somewhat difficult -     Assessment and Plan: Marland KitchenMarland KitchenLashun was seen today for depression.  Diagnoses and all orders for this visit:  Attention deficit disorder (ADD) without hyperactivity -  amphetamine-dextroamphetamine (ADDERALL) 20 MG tablet; Take 1 tablet (20 mg total) by mouth 2 (two) times daily. -     amphetamine-dextroamphetamine (ADDERALL) 20 MG tablet; Take 1 tablet (20 mg total) by mouth 2 (two) times daily. -     amphetamine-dextroamphetamine (ADDERALL) 20 MG tablet; Take 1 tablet (20 mg total) by mouth 2 (two) times daily.  Moderate episode of recurrent major depressive disorder (HCC) -     sertraline (ZOLOFT) 100 MG tablet; Take 1 tablet (100 mg total) by mouth daily. -     lamoTRIgine (LAMICTAL) 25 MG tablet; Take 1 tablet (25 mg total) by mouth daily.   Increased zoloft and added lamictal 25mg  daily.  Continue to work on exercise and hobbies etc. Follow up with any major mood changes or concerns.   Refilled adderall for 3 months.   Follow up in 3 months.      Follow Up Instructions:    I discussed the assessment and treatment plan with the patient. The patient was provided an opportunity to ask questions and all were answered. The patient agreed with the plan and demonstrated an understanding of the instructions.   The patient was advised to call back or seek an in-person evaluation if the symptoms worsen or if the condition fails to improve as anticipated.   , PA-C

## 2019-01-19 ENCOUNTER — Encounter: Payer: Self-pay | Admitting: Physician Assistant

## 2019-02-16 ENCOUNTER — Telehealth: Payer: Self-pay | Admitting: Neurology

## 2019-02-16 NOTE — Telephone Encounter (Signed)
Patient left vm stating she needs refills on Adderall. Left message on machine letting patient know RX was written to be filled 02/14/2019 and 03/17/2019. She is to call back with any additional questions.

## 2019-03-09 DIAGNOSIS — G5701 Lesion of sciatic nerve, right lower limb: Secondary | ICD-10-CM | POA: Diagnosis not present

## 2019-03-09 DIAGNOSIS — G894 Chronic pain syndrome: Secondary | ICD-10-CM | POA: Diagnosis not present

## 2019-03-09 DIAGNOSIS — Z79891 Long term (current) use of opiate analgesic: Secondary | ICD-10-CM | POA: Diagnosis not present

## 2019-03-09 DIAGNOSIS — M5136 Other intervertebral disc degeneration, lumbar region: Secondary | ICD-10-CM | POA: Diagnosis not present

## 2019-03-09 DIAGNOSIS — M129 Arthropathy, unspecified: Secondary | ICD-10-CM | POA: Diagnosis not present

## 2019-04-14 DIAGNOSIS — Z20828 Contact with and (suspected) exposure to other viral communicable diseases: Secondary | ICD-10-CM | POA: Diagnosis not present

## 2019-04-14 DIAGNOSIS — R5081 Fever presenting with conditions classified elsewhere: Secondary | ICD-10-CM | POA: Diagnosis not present

## 2019-04-14 DIAGNOSIS — R110 Nausea: Secondary | ICD-10-CM | POA: Diagnosis not present

## 2019-04-14 DIAGNOSIS — Z2089 Contact with and (suspected) exposure to other communicable diseases: Secondary | ICD-10-CM | POA: Diagnosis not present

## 2019-04-18 ENCOUNTER — Other Ambulatory Visit: Payer: Self-pay | Admitting: Physician Assistant

## 2019-04-18 DIAGNOSIS — F331 Major depressive disorder, recurrent, moderate: Secondary | ICD-10-CM

## 2019-04-20 ENCOUNTER — Other Ambulatory Visit: Payer: Self-pay

## 2019-04-20 ENCOUNTER — Emergency Department
Admission: EM | Admit: 2019-04-20 | Discharge: 2019-04-20 | Disposition: A | Discharge status: Home / Self Care | Payer: Self-pay | Source: Home / Self Care | Attending: Family Medicine | Admitting: Family Medicine

## 2019-04-20 DIAGNOSIS — Z20822 Contact with and (suspected) exposure to covid-19: Secondary | ICD-10-CM

## 2019-04-20 DIAGNOSIS — J069 Acute upper respiratory infection, unspecified: Secondary | ICD-10-CM

## 2019-04-20 DIAGNOSIS — R059 Cough, unspecified: Secondary | ICD-10-CM

## 2019-04-20 DIAGNOSIS — R05 Cough: Secondary | ICD-10-CM

## 2019-04-20 LAB — POC INFLUENZA A AND B ANTIGEN (URGENT CARE ONLY)
Influenza A Ag: NEGATIVE
Influenza B Ag: NEGATIVE

## 2019-04-20 LAB — POCT RAPID STREP A (OFFICE): Rapid Strep A Screen: NEGATIVE

## 2019-04-20 NOTE — ED Provider Notes (Signed)
Stacey Alvarado CARE    CSN: [id number] Arrival date & time: 04/20/19  1419      History   Chief Complaint Chief Complaint  Patient presents with  . Sore Throat  . Fever  . Generalized Body Aches    HPI Stacey Alvarado is a 48 y.o. female.   One week ago patient developed fever, myalgias, nasal congestion,and mild sore throat.  Yesterday her symptoms became worse, and she has felt shortness of breath at times.  She denies changes in taste/smell.  The history is provided by the patient.    History reviewed. No pertinent past medical history.  Patient Active Problem List   Diagnosis Date Noted  . Single skin nodule 12/10/2016  . Left hand paresthesia 12/10/2016  . B12 deficiency 08/14/2016  . No energy 08/13/2016  . Class 1 obesity due to excess calories without serious comorbidity in adult 02/14/2016  . Vitamin D deficiency 05/07/2014  . 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 Multiple joint pain 07/07/2013  . ADD (attention deficit disorder) 06/26/2013  . Cervical radiculitis 06/26/2013  . HAND PAIN 09/27/2010  . DYSFUNCTION OF EUSTACHIAN TUBE 05/25/2010  . BACK PAIN, UPPER 05/30/2009  . Hypothyroidism 09/02/2007  . HYPERLIPIDEMIA 09/02/2007  . TOBACCO ABUSE 09/02/2007  . Depression 09/02/2007    Past Surgical History:  Procedure Laterality Date  . ABDOMINAL HYSTERECTOMY    . CARPAL TUNNEL RELEASE    . CHOLECYSTECTOMY    . TONSILLECTOMY      OB History   No obstetric history on file.      Home Medications    Prior to Admission medications   Medication Sig Start Date End Date Taking? Authorizing Provider  amphetamine-dextroamphetamine (ADDERALL) 20 MG tablet Take 1 tablet (20 mg total) by mouth 2 (two) times daily. 03/17/19   Breeback, Jade L, PA-C  amphetamine-dextroamphetamine (ADDERALL) 20 MG tablet Take 1 tablet (20 mg total) by mouth 2 (two) times daily. 02/14/19   Breeback, Jade L, PA-C  amphetamine-dextroamphetamine  (ADDERALL) 20 MG tablet Take 1 tablet (20 mg total) by mouth 2 (two) times daily. 01/15/19   Breeback, Jade L, PA-C  ipratropium (ATROVENT) 0.06 % nasal spray Place 2 sprays into both nostrils 3 (three) times daily. 11/28/18   Sunnie Nielsen, DO  lamoTRIgine (LAMICTAL) 25 MG tablet Take 1 tablet (25 mg total) by mouth daily. 01/15/19   Breeback, Jade L, PA-C  sertraline (ZOLOFT) 100 MG tablet Take 1 tablet (100 mg total) by mouth daily. NEEDS APPT 04/20/19   Jomarie Longs, PA-C    Family History History reviewed. No pertinent family history.  Social History Social History   Tobacco Use  . Smoking status: Current Every Day Smoker    Types: Cigarettes  . Smokeless tobacco: Never Used  . Tobacco comment: vapor cigs  Substance Use Topics  . Alcohol use: No  . Drug use: Not on file     Allergies   Belviq [lorcaserin hcl], Morphine, Moxifloxacin, Trintellix [vortioxetine], Wellbutrin [bupropion], and Propoxyphene   Review of Systems Review of Systems + sore throat ? cough No pleuritic pain No wheezing No nasal congestion + post-nasal drainage No sinus pain/pressure No itchy/red eyes No earache No hemoptysis + SOB + fever, + chills No nausea No vomiting No abdominal pain No diarrhea No urinary symptoms No skin rash + fatigue + myalgias + headache    Physical Exam Triage Vital Signs ED Triage Vitals  Enc Vitals Group     BP 04/20/19 1542 (!)  155/84     Pulse Rate 04/20/19 1542 (!) 108     Resp 04/20/19 1542 20     Temp 04/20/19 1542 97.8 F (36.6 C)     Temp Source 04/20/19 1542 Oral     SpO2 04/20/19 1542 98 %     Weight 04/20/19 1544 195 lb (88.5 kg)     Height 04/20/19 1544 5\' 8"  (1.727 m)     Head Circumference --      Peak Flow --      Pain Score 04/20/19 1543 5     Pain Loc --      Pain Edu? --      Excl. in GC? --    No data found.  Updated Vital Signs BP (!) 155/84 (BP Location: Right Arm)   Pulse (!) 108   Temp 97.8 F (36.6 C) (Oral)    Resp 20   Ht 5\' 8"  (1.727 m)   Wt 88.5 kg   SpO2 98%   BMI 29.65 kg/m   Visual Acuity Right Eye Distance:   Left Eye Distance:   Bilateral Distance:    Right Eye Near:   Left Eye Near:    Bilateral Near:     Physical Exam Nursing notes and Vital Signs reviewed. Appearance:  Patient appears stated age, and in no acute distress Eyes:  Pupils are equal, round, and reactive to light and accomodation.  Extraocular movement is intact.  Conjunctivae are not inflamed  Ears:  Canals normal.  Tympanic membranes normal.  Nose:  Mildly congested turbinates.  No sinus tenderness.  Pharynx:  Normal Neck:  Supple.  Mildly enlarged lateral nodes are present, tender to palpation on the left.   Lungs:  Clear to auscultation.  Breath sounds are equal.  Moving air well. Heart:  Regular rate and rhythm without murmurs, rubs, or gallops.  Abdomen:  Nontender without masses or hepatosplenomegaly.  Bowel sounds are present.  No CVA or flank tenderness.  Extremities:  No edema.  Skin:  No rash present.   UC Treatments / Results  Labs (all labs ordered are listed, but only abnormal results are displayed) Labs Reviewed  NOVEL CORONAVIRUS, NAA  SARS-COV-2 RNA,(COVID-19) QUALITATIVE NAAT  POCT RAPID STREP A (OFFICE) negative    EKG   Radiology No results found.  Procedures Procedures (including critical care time)  Medications Ordered in UC Medications - No data to display  Initial Impression / Assessment and Plan / UC Course  I have reviewed the triage vital signs and the nursing notes.  Pertinent labs & imaging results that were available during my care of the patient were reviewed by me and considered in my medical decision making (see chart for details).    Benign exam.  Treat symptomatically for now.  There is no evidence of bacterial infection today.   COVID19 send out   Final Clinical Impressions(s) / UC Diagnoses   Final diagnoses:  Cough  Viral URI with cough  Encounter  for laboratory testing for COVID-19 virus     Discharge Instructions     Increase Vitamin D3 to 5000 units daily, and Vitamin C 500mg  twice daily.  Take plain guaifenesin (1200mg  extended release tabs such as Mucinex) twice daily, with plenty of water, for cough and congestion. Get adequate rest.   May use Afrin nasal spray (or generic oxymetazoline) each morning for about 5 days and then discontinue.  Also recommend using saline nasal spray several times daily and saline nasal irrigation (AYR is a  common brand).  Use Flonase nasal spray each morning after using Afrin nasal spray and saline nasal irrigation. Try warm salt water gargles for sore throat.  Stop all antihistamines for now, and other non-prescription cough/cold preparations. May take Ibuprofen or Tylenol as needed for body aches, fever, etc. May take Delsym Cough Suppressant at bedtime for nighttime cough.   Isolate yourself until COVID-19 test result is available.   If your COVID19 test is positive, then you are infected with the novel coronavirus and could give the virus to others.  Please continue isolation at home for at least 10 days since the start of your symptoms.   Once you complete your 10 day quarantine, you may return to normal activities as long as you've not had a fever for over 24 hours (without taking fever reducing medicine) and your symptoms are improving. Please continue good preventive care measures, including:  frequent hand-washing, avoid touching your face, cover coughs/sneezes, stay out of crowds and keep a 6 foot distance from others.  Go to the nearest hospital emergency room if fever/cough/breathlessness are severe or illness seems like a threat to life.     ED Prescriptions    None        Kandra Nicolas, MD 04/24/19 [phone]

## 2019-04-20 NOTE — ED Notes (Signed)
Due to issues with rapid COVID, pt is not charged for rapid-  Rapid came back negative.  Send out sent to [facility].

## 2019-04-20 NOTE — ED Triage Notes (Signed)
Started a week ago today with cold sx.  Fever body aches, sore throat, nasal congestion.  Yesterday, fatigue, and increase in the other sx.  Also has SOB at times.

## 2019-04-20 NOTE — Discharge Instructions (Addendum)
Increase Vitamin D3 to 5000 units daily, and Vitamin C 500mg  twice daily.  Take plain guaifenesin (1200mg  extended release tabs such as Mucinex) twice daily, with plenty of water, for cough and congestion. Get adequate rest.   May use Afrin nasal spray (or generic oxymetazoline) each morning for about 5 days and then discontinue.  Also recommend using saline nasal spray several times daily and saline nasal irrigation (AYR is a common brand).  Use Flonase nasal spray each morning after using Afrin nasal spray and saline nasal irrigation. Try warm salt water gargles for sore throat.  Stop all antihistamines for now, and other non-prescription cough/cold preparations. May take Ibuprofen or Tylenol as needed for body aches, fever, etc. May take Delsym Cough Suppressant at bedtime for nighttime cough.   Isolate yourself until COVID-19 test result is available.   If your COVID19 test is positive, then you are infected with the novel coronavirus and could give the virus to others.  Please continue isolation at home for at least 10 days since the start of your symptoms.   Once you complete your 10 day quarantine, you may return to normal activities as long as you've not had a fever for over 24 hours (without taking fever reducing medicine) and your symptoms are improving. Please continue good preventive care measures, including:  frequent hand-washing, avoid touching your face, cover coughs/sneezes, stay out of crowds and keep a 6 foot distance from others.  Go to the nearest hospital emergency room if fever/cough/breathlessness are severe or illness seems like a threat to life.

## 2019-04-22 LAB — CULTURE, GROUP A STREP
MICRO NUMBER:: 1236385
SPECIMEN QUALITY:: ADEQUATE

## 2019-04-22 LAB — STREP A DNA PROBE

## 2019-04-22 LAB — SARS-COV-2 RNA,(COVID-19) QUALITATIVE NAAT: SARS CoV2 RNA: NOT DETECTED

## 2019-04-30 ENCOUNTER — Encounter: Payer: Self-pay | Admitting: Medical-Surgical

## 2019-04-30 ENCOUNTER — Ambulatory Visit (INDEPENDENT_AMBULATORY_CARE_PROVIDER_SITE_OTHER): Payer: BC Managed Care – PPO | Admitting: Medical-Surgical

## 2019-04-30 VITALS — Temp 100.0°F | Ht 68.0 in | Wt 195.0 lb

## 2019-04-30 DIAGNOSIS — J019 Acute sinusitis, unspecified: Secondary | ICD-10-CM

## 2019-04-30 MED ORDER — AMOXICILLIN-POT CLAVULANATE 875-125 MG PO TABS: 1.0000 | ORAL_TABLET | Freq: Two times a day (BID) | ORAL | 0 refills | Status: AC

## 2019-04-30 NOTE — Progress Notes (Signed)
Virtual Visit via Video Note  I connected with Stacey Alvarado on 04/30/19 at  2:00 PM EST by a video enabled telemedicine application and verified that I am speaking with the correct person using two identifiers.   I discussed the limitations of evaluation and management by telemedicine and the availability of in person appointments. The patient expressed understanding and agreed to proceed.  Subjective:    CC: sinus congestion, upper respiratory symptoms  HPI:  Pleasant 49 year old female presenting today with reports of sinus congestion and upper respiratory symptoms x 2 weeks.  + Sore throat, headache, sinus congestion, nonproductive cough, postnasal drip, ear pressure/pain, and facial pain over the forehead and eyes x2 weeks.  Was exposed to Covid by family member but was tested last week with negative results.  Denies fever, nausea, vomiting, diarrhea, loss of taste/smell, abdominal pain, fatigue, and weakness.  No nasal discharge or rhinorrhea, just reports feeling stuffy.  Has been using ibuprofen as needed for headaches.    Past medical history, Surgical history, Family history not pertinant except as noted below, Social history, Allergies, and medications have been entered into the medical record, reviewed, and corrections made.   Review of Systems: No fevers, chills, night sweats, weight loss, chest pain, or shortness of breath.   Objective:    General: Speaking clearly in complete sentences without any shortness of breath.  Alert and oriented x3.  Normal judgment. No apparent acute distress.    Impression and Recommendations:    Acute sinusitis with symptoms greater than 10 days    Augmentin twice daily x 10 days.  May use saline nasal sprays/rinses.  Offered Atrovent nasal spray, patient declined at this time.  Continue ibuprofen/Tylenol as needed.  I discussed the assessment and treatment plan with the patient. The patient was provided an opportunity to ask questions and  all were answered. The patient agreed with the plan and demonstrated an understanding of the instructions.   The patient was advised to call back or seek an in-person evaluation if the symptoms worsen or if the condition fails to improve as anticipated.   Thayer Ohm, DNP, APRN, FNP-BC [facility]

## 2019-05-01 ENCOUNTER — Ambulatory Visit: Payer: BC Managed Care – PPO | Admitting: Physician Assistant

## 2019-05-07 ENCOUNTER — Encounter: Payer: Self-pay | Admitting: Physician Assistant

## 2019-05-07 ENCOUNTER — Ambulatory Visit (INDEPENDENT_AMBULATORY_CARE_PROVIDER_SITE_OTHER): Payer: BC Managed Care – PPO | Admitting: Physician Assistant

## 2019-05-07 VITALS — Temp 99.0°F | Ht 68.0 in | Wt 195.0 lb

## 2019-05-07 DIAGNOSIS — F39 Unspecified mood [affective] disorder: Secondary | ICD-10-CM | POA: Diagnosis not present

## 2019-05-07 DIAGNOSIS — F988 Other specified behavioral and emotional disorders with onset usually occurring in childhood and adolescence: Secondary | ICD-10-CM | POA: Diagnosis not present

## 2019-05-07 DIAGNOSIS — F332 Major depressive disorder, recurrent severe without psychotic features: Secondary | ICD-10-CM

## 2019-05-07 MED ORDER — OLANZAPINE-FLUOXETINE HCL 6-50 MG PO CAPS: 1.0000 | ORAL_CAPSULE | Freq: Every day | ORAL | 0 refills | Status: DC

## 2019-05-07 MED ORDER — OLANZAPINE-FLUOXETINE HCL 3-25 MG PO CAPS: 1.0000 | ORAL_CAPSULE | Freq: Every evening | ORAL | 0 refills | Status: DC

## 2019-05-07 MED ORDER — AMPHETAMINE-DEXTROAMPHETAMINE 20 MG PO TABS: 20.0000 mg | ORAL_TABLET | Freq: Two times a day (BID) | ORAL | 0 refills | Status: DC

## 2019-05-07 NOTE — Progress Notes (Signed)
Patient ID: Stacey Alvarado, female   DOB: Jan 17, 1971, 49 y.o.   MRN: [id number] .Marland KitchenVirtual Visit via Video Note  I connected with Stacey Alvarado on 05/07/2019 at  3:40 PM EST by a video enabled telemedicine application and verified that I am speaking with the correct person using two identifiers.  Location: Patient: home Provider: clinic   I discussed the limitations of evaluation and management by telemedicine and the availability of in person appointments. The patient expressed understanding and agreed to proceed.  History of Present Illness: Pt is a 49 yo female with ADHD, MDD who calls into the clinic for refills.   Her adderall is doing great. No concerns or complaints. She denies any problems with sleep, anxiety, palpitations, headaches.   Her depressed mood is still not good. She wants to try another medication. zoloft had previously worked the best but is not cutting it. She has no motivation. She is very tearful. She finds trouble finding enjoyment in anything. No SI/HC. No hallucinations.    She has tried and failed:   Paxil zoloft lexapro vibryd lamictal abilify trintellix Effector pristiq cymbalta   Active Ambulatory Problems    Diagnosis Date Noted  . Hypothyroidism 09/02/2007  . HYPERLIPIDEMIA 09/02/2007  . TOBACCO ABUSE 09/02/2007  . Depression 09/02/2007  . BACK PAIN, UPPER 05/30/2009  . DYSFUNCTION OF EUSTACHIAN TUBE 05/25/2010  . HAND PAIN 09/27/2010  . ADD (attention deficit disorder) 06/26/2013  . Cervical radiculitis 06/26/2013  . Multiple joint pain 07/07/2013  . 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 Vitamin D deficiency 05/07/2014  . Class 1 obesity due to excess calories without serious comorbidity in adult 02/14/2016  . No energy 08/13/2016  . B12 deficiency 08/14/2016  . Single skin nodule 12/10/2016  . Left hand paresthesia 12/10/2016  . Severe episode of recurrent major depressive disorder, without psychotic features  (HCC) 05/10/2019  . Mood disorder (HCC) 05/10/2019   Resolved Ambulatory Problems    Diagnosis Date Noted  . LOM 12/16/2007  . BRONCHITIS, ACUTE 01/08/2008  . URI 05/25/2010  . Essential hypertension 01/17/2015   No Additional Past Medical History   Reviewed med, allergy, problem list.    Observations/Objective: No acute distress.  Flat affect.  Normal breathing.   .. Today's Vitals   05/07/19 1527  Temp: 99 F (37.2 C)  TempSrc: Oral  Weight: 195 lb (88.5 kg)  Height: 5\' 8"  (1.727 m)   Body mass index is 29.65 kg/m.   .. Depression screen [facility] 2/9 05/07/2019 05/31/2017 03/08/2017 12/05/2016 08/13/2016  Decreased Interest 3 3 3 3 2   Down, Depressed, Hopeless 2 2 2 3 2   PHQ - 2 Score 5 5 5 6 4   Altered sleeping 3 1 1  0 0  Tired, decreased energy 2 2 2 2 1   Change in appetite 0 2 3 3 2   Feeling bad or failure about yourself  3 2 2 2 2   Trouble concentrating 2 1 1 2 1   Moving slowly or fidgety/restless 0 1 0 1 1  Suicidal thoughts 0 0 0 0 0  PHQ-9 Score 15 14 14 16 11   Difficult doing work/chores Very difficult Very difficult Somewhat difficult - -   .08/15/2016 GAD 7 : Generalized Anxiety Score 05/07/2019 05/31/2017 03/08/2017  Nervous, Anxious, on Edge 2 1 1   Control/stop worrying 1 1 1   Worry too much - different things 1 1 1   Trouble relaxing 3 1 1   Restless 2 1 0  Easily annoyed or irritable 3  2 3  Afraid - awful might happen 0 0 1  Total GAD 7 Score 12 7 8   Anxiety Difficulty Very difficult Somewhat difficult -      Assessment and Plan: Marland KitchenMarland KitchenChaquetta was seen today for medication management.  Diagnoses and all orders for this visit:  Severe episode of recurrent major depressive disorder, without psychotic features ([place]) -     OLANZapine-FLUoxetine (SYMBYAX) 3-25 MG capsule; Take 1 capsule by mouth every evening. Increase to next dose after 2 weeks. -     OLANZapine-FLUoxetine (SYMBYAX) 6-50 MG capsule; Take 1 capsule by mouth at bedtime.  Attention deficit disorder  (ADD) without hyperactivity -     amphetamine-dextroamphetamine (ADDERALL) 20 MG tablet; Take 1 tablet (20 mg total) by mouth 2 (two) times daily. -     amphetamine-dextroamphetamine (ADDERALL) 20 MG tablet; Take 1 tablet (20 mg total) by mouth 2 (two) times daily. -     amphetamine-dextroamphetamine (ADDERALL) 20 MG tablet; Take 1 tablet (20 mg total) by mouth 2 (two) times daily.  Mood disorder (HCC) -     OLANZapine-FLUoxetine (SYMBYAX) 3-25 MG capsule; Take 1 capsule by mouth every evening. Increase to next dose after 2 weeks. -     OLANZapine-FLUoxetine (SYMBYAX) 6-50 MG capsule; Take 1 capsule by mouth at bedtime.   PHQ is not to goal. Pt has not tried prozac and suspect her depressed mood could be more bipolar depression. Will try symbyax. Discussed how to titrate up. Follow up in 6 weeks. Refilled adderall for 3 months.     Follow Up Instructions:    I discussed the assessment and treatment plan with the patient. The patient was provided an opportunity to ask questions and all were answered. The patient agreed with the plan and demonstrated an understanding of the instructions.   The patient was advised to call back or seek an in-person evaluation if the symptoms worsen or if the condition fails to improve as anticipated.   Iran Planas, PA-C

## 2019-05-07 NOTE — Progress Notes (Signed)
PHQ9 (15) -GAD7 (12) completed. Wants to discuss alternative to Zoloft.   Needs refills on Adderall.

## 2019-05-10 DIAGNOSIS — F332 Major depressive disorder, recurrent severe without psychotic features: Secondary | ICD-10-CM | POA: Insufficient documentation

## 2019-05-10 DIAGNOSIS — F39 Unspecified mood [affective] disorder: Secondary | ICD-10-CM | POA: Insufficient documentation

## 2019-05-11 DIAGNOSIS — Z03818 Encounter for observation for suspected exposure to other biological agents ruled out: Secondary | ICD-10-CM | POA: Diagnosis not present

## 2019-05-11 DIAGNOSIS — Z20828 Contact with and (suspected) exposure to other viral communicable diseases: Secondary | ICD-10-CM | POA: Diagnosis not present

## 2019-05-25 ENCOUNTER — Other Ambulatory Visit: Payer: Self-pay | Admitting: Physician Assistant

## 2019-05-25 DIAGNOSIS — F332 Major depressive disorder, recurrent severe without psychotic features: Secondary | ICD-10-CM

## 2019-05-25 DIAGNOSIS — F39 Unspecified mood [affective] disorder: Secondary | ICD-10-CM

## 2019-05-26 ENCOUNTER — Other Ambulatory Visit: Payer: Self-pay | Admitting: Physician Assistant

## 2019-05-26 DIAGNOSIS — M129 Arthropathy, unspecified: Secondary | ICD-10-CM | POA: Diagnosis not present

## 2019-05-26 DIAGNOSIS — M5136 Other intervertebral disc degeneration, lumbar region: Secondary | ICD-10-CM | POA: Diagnosis not present

## 2019-05-26 DIAGNOSIS — G894 Chronic pain syndrome: Secondary | ICD-10-CM | POA: Diagnosis not present

## 2019-05-26 DIAGNOSIS — G5701 Lesion of sciatic nerve, right lower limb: Secondary | ICD-10-CM | POA: Diagnosis not present

## 2019-05-26 DIAGNOSIS — F331 Major depressive disorder, recurrent, moderate: Secondary | ICD-10-CM

## 2019-05-29 ENCOUNTER — Encounter: Payer: Self-pay | Admitting: Nurse Practitioner

## 2019-05-29 ENCOUNTER — Telehealth (INDEPENDENT_AMBULATORY_CARE_PROVIDER_SITE_OTHER): Payer: BC Managed Care – PPO | Admitting: Nurse Practitioner

## 2019-05-29 VITALS — Temp 98.3°F

## 2019-05-29 DIAGNOSIS — N3090 Cystitis, unspecified without hematuria: Secondary | ICD-10-CM | POA: Diagnosis not present

## 2019-05-29 MED ORDER — NITROFURANTOIN MONOHYD MACRO 100 MG PO CAPS: 100.0000 mg | ORAL_CAPSULE | Freq: Two times a day (BID) | ORAL | 0 refills | Status: DC

## 2019-05-29 NOTE — Patient Instructions (Signed)
Urinary Tract Infection, Adult A urinary tract infection (UTI) is an infection of any part of the urinary tract. The urinary tract includes:  The kidneys.  The ureters.  The bladder.  The urethra. These organs make, store, and get rid of pee (urine) in the body. What are the causes? This is caused by germs (bacteria) in your genital area. These germs grow and cause swelling (inflammation) of your urinary tract. What increases the risk? You are more likely to develop this condition if:  You have a small, thin tube (catheter) to drain pee.  You cannot control when you pee or poop (incontinence).  You are female, and: ? You use these methods to prevent pregnancy:  A medicine that kills sperm (spermicide).  A device that blocks sperm (diaphragm). ? You have low levels of a female hormone (estrogen). ? You are pregnant.  You have genes that add to your risk.  You are sexually active.  You take antibiotic medicines.  You have trouble peeing because of: ? A prostate that is bigger than normal, if you are female. ? A blockage in the part of your body that drains pee from the bladder (urethra). ? A kidney stone. ? A nerve condition that affects your bladder (neurogenic bladder). ? Not getting enough to drink. ? Not peeing often enough.  You have other conditions, such as: ? Diabetes. ? A weak disease-fighting system (immune system). ? Sickle cell disease. ? Gout. ? Injury of the spine. What are the signs or symptoms? Symptoms of this condition include:  Needing to pee right away (urgently).  Peeing often.  Peeing small amounts often.  Pain or burning when peeing.  Blood in the pee.  Pee that smells bad or not like normal.  Trouble peeing.  Pee that is cloudy.  Fluid coming from the vagina, if you are female.  Pain in the belly or lower back. Other symptoms include:  Throwing up (vomiting).  No urge to eat.  Feeling mixed up (confused).  Being tired  and grouchy (irritable).  A fever.  Watery poop (diarrhea). How is this treated? This condition may be treated with:  Antibiotic medicine.  Other medicines.  Drinking enough water. Follow these instructions at home:  Medicines  Take over-the-counter and prescription medicines only as told by your doctor.  If you were prescribed an antibiotic medicine, take it as told by your doctor. Do not stop taking it even if you start to feel better. General instructions  Make sure you: ? Pee until your bladder is empty. ? Do not hold pee for a long time. ? Empty your bladder after sex. ? Wipe from front to back after pooping if you are a female. Use each tissue one time when you wipe.  Drink enough fluid to keep your pee pale yellow.  Keep all follow-up visits as told by your doctor. This is important. Contact a doctor if:  You do not get better after 1-2 days.  Your symptoms go away and then come back. Get help right away if:  You have very bad back pain.  You have very bad pain in your lower belly.  You have a fever.  You are sick to your stomach (nauseous).  You are throwing up. Summary  A urinary tract infection (UTI) is an infection of any part of the urinary tract.  This condition is caused by germs in your genital area.  There are many risk factors for a UTI. These include having a small, thin   tube to drain pee and not being able to control when you pee or poop.  Treatment includes antibiotic medicines for germs.  Drink enough fluid to keep your pee pale yellow. This information is not intended to replace advice given to you by your health care provider. Make sure you discuss any questions you have with your health care provider. Document Revised: 03/27/2018 Document Reviewed: 10/17/2017 Elsevier Patient Education  2020 [employer]  

## 2019-05-29 NOTE — Progress Notes (Signed)
rtual Visit via Video Note  I connected with [name] on 05/29/19 at  1:20 PM EST by the video enabled telemedicine application, MyChart, and verified that I am speaking with the correct person using two identifiers.   I introduced myself as a Publishing rights manager with the practice. We discussed the limitations of evaluation and management by telemedicine and the availability of in person appointments. The patient expressed understanding and agreed to proceed.  The patient is: at home I am: in the office  Subjective:    CC: UTI  HPI: Stacey Alvarado is a 49 y.o. y/o female presenting via MyCHart today for symptoms of urinary tract infection for the past 2 days.  Patient reports dysuria, lower back pain, urinary frequency and urgency that started 2 days ago.  Patient reports the symptoms have worsened with time.  She has been using Azo and ibuprofen with minimal relief.  She denies fever, hematuria, pelvic pain, nausea, vomiting, vaginal infection, history of kidney infection, history of kidney stones, history of STI.     Past medical history, Surgical history, Family history not pertinant except as noted below, Social history, Allergies, and medications have been entered into the medical record, reviewed, and corrections made.   Review of Systems:  Pertinent positive negatives listed in HPI.  Objective:    General: Speaking clearly in complete sentences without any shortness of breath.  Alert and oriented x3.  Normal judgment. No apparent acute distress.   Impression and Recommendations:    1. Cystitis Presentation and symptoms most correlate with acute cystitis.  Due to Covid precautions patient unable to come into the office today for urinalysis.  Will treat empirically with Macrobid for 5 days. Discussed with patient the importance of increasing water intake and Tylenol or ibuprofen for pain.   Educated patient on signs and symptoms that would warrant emergency or urgent care  over the weekend.  If symptoms worsen or fail to improve over the weekend patient encouraged to call the office and at that time urinalysis will need to be performed. - nitrofurantoin, macrocrystal-monohydrate, (MACROBID) 100 MG capsule; Take 1 capsule (100 mg total) by mouth 2 (two) times daily.  Dispense: 10 capsule; Refill: 0    I discussed the assessment and treatment plan with the patient. The patient was provided an opportunity to ask questions and all were answered. The patient agreed with the plan and demonstrated an understanding of the instructions.   The patient was advised to call back or seek an in-person evaluation if the symptoms worsen or if the condition fails to improve as anticipated.  Return if symptoms worsen or fail to improve.   20 minutes total time spent on encounter  Tollie Eth, NP

## 2019-06-16 ENCOUNTER — Encounter: Payer: Self-pay | Admitting: Physician Assistant

## 2019-06-16 ENCOUNTER — Other Ambulatory Visit: Payer: Self-pay | Admitting: Neurology

## 2019-06-16 ENCOUNTER — Telehealth (INDEPENDENT_AMBULATORY_CARE_PROVIDER_SITE_OTHER): Payer: BC Managed Care – PPO | Admitting: Physician Assistant

## 2019-06-16 VITALS — Temp 98.3°F | Ht 68.0 in | Wt 198.0 lb

## 2019-06-16 DIAGNOSIS — F331 Major depressive disorder, recurrent, moderate: Secondary | ICD-10-CM | POA: Diagnosis not present

## 2019-06-16 DIAGNOSIS — F419 Anxiety disorder, unspecified: Secondary | ICD-10-CM

## 2019-06-16 DIAGNOSIS — G479 Sleep disorder, unspecified: Secondary | ICD-10-CM | POA: Diagnosis not present

## 2019-06-16 DIAGNOSIS — F332 Major depressive disorder, recurrent severe without psychotic features: Secondary | ICD-10-CM

## 2019-06-16 DIAGNOSIS — F39 Unspecified mood [affective] disorder: Secondary | ICD-10-CM

## 2019-06-16 MED ORDER — OLANZAPINE-FLUOXETINE HCL 6-50 MG PO CAPS: 1.0000 | ORAL_CAPSULE | Freq: Every day | ORAL | 0 refills | Status: DC

## 2019-06-16 MED ORDER — BUSPIRONE HCL 7.5 MG PO TABS: 7.5000 mg | ORAL_TABLET | Freq: Two times a day (BID) | ORAL | 1 refills | Status: DC

## 2019-06-16 MED ORDER — SERTRALINE HCL 100 MG PO TABS: 100.0000 mg | ORAL_TABLET | Freq: Every day | ORAL | 1 refills | Status: DC

## 2019-06-16 MED ORDER — TRAZODONE HCL 50 MG PO TABS: 25.0000 mg | ORAL_TABLET | Freq: Every evening | ORAL | 1 refills | Status: DC | PRN

## 2019-06-16 NOTE — Progress Notes (Signed)
Patient ID: Stacey Alvarado, female   DOB: May 25, 1970, 49 y.o.   MRN: [id number] .Marland KitchenVirtual Visit via Video Note  I connected with Stacey Alvarado on 06/16/19 at 10:50 AM EST by a video enabled telemedicine application and verified that I am speaking with the correct person using two identifiers.  Location: Patient: home Provider: clinic   I discussed the limitations of evaluation and management by telemedicine and the availability of in person appointments. The patient expressed understanding and agreed to proceed.  History of Present Illness: Pt is a 49 yo female with ADHD, MDD who calls in to discuss new anxiety.   She has never really had any problems with anxiety until after she stopped the last medication symbyax. Since she has been not sleeping, restless but still down and depressed. She restarted zoloft 50mg . She feels like her mind will not shut down.   She has tried numerous medications.    Paxil zoloft lexapro vibryd lamictal abilify trintellix Effector pristiq cymbalta symbyax-weight gain   .Marland Kitchen Active Ambulatory Problems    Diagnosis Date Noted  . Hypothyroidism 09/02/2007  . HYPERLIPIDEMIA 09/02/2007  . TOBACCO ABUSE 09/02/2007  . Depression 09/02/2007  . BACK PAIN, UPPER 05/30/2009  . DYSFUNCTION OF EUSTACHIAN TUBE 05/25/2010  . HAND PAIN 09/27/2010  . ADD (attention deficit disorder) 06/26/2013  . Cervical radiculitis 06/26/2013  . Multiple joint pain 07/07/2013  . 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 Vitamin D deficiency 05/07/2014  . Class 1 obesity due to excess calories without serious comorbidity in adult 02/14/2016  . No energy 08/13/2016  . B12 deficiency 08/14/2016  . Single skin nodule 12/10/2016  . Left hand paresthesia 12/10/2016  . Severe episode of recurrent major depressive disorder, without psychotic features ([place]) 05/10/2019  . Mood disorder ([place]) 05/10/2019   Resolved Ambulatory Problems    Diagnosis Date Noted   . LOM 12/16/2007  . BRONCHITIS, ACUTE 01/08/2008  . URI 05/25/2010  . Essential hypertension 01/17/2015   No Additional Past Medical History   Reviewed med, allergy, problem list.    Observations/Objective: No acute distress.  Normal mood and appearance.   .. Today's Vitals   06/16/19 1008  Temp: 98.3 F (36.8 C)  TempSrc: Oral  Weight: 198 lb (89.8 kg)  Height: 5\' 8"  (1.727 m)   Body mass index is 30.11 kg/m.  .. Depression screen [facility] 2/9 06/16/2019 05/07/2019 05/31/2017 03/08/2017 12/05/2016  Decreased Interest 3 3 3 3 3   Down, Depressed, Hopeless 3 2 2 2 3   PHQ - 2 Score 6 5 5 5 6   Altered sleeping 3 3 1 1  0  Tired, decreased energy 3 2 2 2 2   Change in appetite 0 0 2 3 3   Feeling bad or failure about yourself  3 3 2 2 2   Trouble concentrating 0 2 1 1 2   Moving slowly or fidgety/restless 0 0 1 0 1  Suicidal thoughts 0 0 0 0 0  PHQ-9 Score 15 15 14 14 16   Difficult doing work/chores Extremely dIfficult Very difficult Very difficult Somewhat difficult -   .. GAD 7 : Generalized Anxiety Score 06/16/2019 05/07/2019 05/31/2017 03/08/2017  Nervous, Anxious, on Edge 3 2 1 1   Control/stop worrying 2 1 1 1   Worry too much - different things 1 1 1 1   Trouble relaxing 3 3 1 1   Restless 3 2 1  0  Easily annoyed or irritable 3 3 2 3   Afraid - awful might happen 0 0 0 1  Total GAD 7 Score 15 12 7 8   Anxiety Difficulty Somewhat difficult Very difficult Somewhat difficult -       Assessment and Plan: Marland KitchenKenyah was seen today for depression.  Diagnoses and all orders for this visit:  Anxiety -     busPIRone (BUSPAR) 7.5 MG tablet; Take 1 tablet (7.5 mg total) by mouth 2 (two) times daily.  Moderate episode of recurrent major depressive disorder (HCC) -     sertraline (ZOLOFT) 100 MG tablet; Take 1 tablet (100 mg total) by mouth daily.  Trouble in sleeping -     traZODone (DESYREL) 50 MG tablet; Take 0.5-1 tablets (25-50 mg total) by mouth at bedtime as needed for  sleep.   Referral placed to [facility] downstairs.  I still feel like their is a mood disorder component.  Added buspar bid and trazodone for sleep.  Continue zoloft increased to 100mg .  Discussed CBT ideas for relaxation.    Follow Up Instructions:    I discussed the assessment and treatment plan with the patient. The patient was provided an opportunity to ask questions and all were answered. The patient agreed with the plan and demonstrated an understanding of the instructions.   The patient was advised to call back or seek an in-person evaluation if the symptoms worsen or if the condition fails to improve as anticipated.  I provided 15 minutes of non-face-to-face time during this encounter.   [facility], PA-C

## 2019-06-16 NOTE — Progress Notes (Signed)
Patient stopped Symbyax - stated it made her gain weight Went back on zoloft 50 mg daily PHQ9 (15) -GAD7 (15) completed.

## 2019-06-24 DIAGNOSIS — Z20828 Contact with and (suspected) exposure to other viral communicable diseases: Secondary | ICD-10-CM | POA: Diagnosis not present

## 2019-06-24 DIAGNOSIS — Z03818 Encounter for observation for suspected exposure to other biological agents ruled out: Secondary | ICD-10-CM | POA: Diagnosis not present

## 2019-06-24 DIAGNOSIS — R197 Diarrhea, unspecified: Secondary | ICD-10-CM | POA: Diagnosis not present

## 2019-06-27 DIAGNOSIS — Z03818 Encounter for observation for suspected exposure to other biological agents ruled out: Secondary | ICD-10-CM | POA: Diagnosis not present

## 2019-06-27 DIAGNOSIS — Z20828 Contact with and (suspected) exposure to other viral communicable diseases: Secondary | ICD-10-CM | POA: Diagnosis not present

## 2019-07-28 ENCOUNTER — Encounter: Payer: Self-pay | Admitting: Physician Assistant

## 2019-07-28 DIAGNOSIS — F988 Other specified behavioral and emotional disorders with onset usually occurring in childhood and adolescence: Secondary | ICD-10-CM

## 2019-07-29 MED ORDER — AMPHETAMINE-DEXTROAMPHETAMINE 20 MG PO TABS: 20.0000 mg | ORAL_TABLET | Freq: Two times a day (BID) | ORAL | 0 refills | Status: DC

## 2019-08-17 ENCOUNTER — Other Ambulatory Visit: Payer: Self-pay | Admitting: Neurology

## 2019-08-17 DIAGNOSIS — F331 Major depressive disorder, recurrent, moderate: Secondary | ICD-10-CM

## 2019-08-17 DIAGNOSIS — G479 Sleep disorder, unspecified: Secondary | ICD-10-CM

## 2019-08-17 MED ORDER — TRAZODONE HCL 50 MG PO TABS: 25.0000 mg | ORAL_TABLET | Freq: Every evening | ORAL | 1 refills | Status: DC | PRN

## 2019-08-17 MED ORDER — SERTRALINE HCL 100 MG PO TABS: 100.0000 mg | ORAL_TABLET | Freq: Every day | ORAL | 1 refills | Status: DC

## 2019-08-26 DIAGNOSIS — Z5181 Encounter for therapeutic drug level monitoring: Secondary | ICD-10-CM | POA: Diagnosis not present

## 2019-08-26 DIAGNOSIS — M5136 Other intervertebral disc degeneration, lumbar region: Secondary | ICD-10-CM | POA: Diagnosis not present

## 2019-08-26 DIAGNOSIS — Z79891 Long term (current) use of opiate analgesic: Secondary | ICD-10-CM | POA: Diagnosis not present

## 2019-08-26 DIAGNOSIS — Z79899 Other long term (current) drug therapy: Secondary | ICD-10-CM | POA: Diagnosis not present

## 2019-08-26 DIAGNOSIS — M129 Arthropathy, unspecified: Secondary | ICD-10-CM | POA: Diagnosis not present

## 2019-08-26 DIAGNOSIS — G894 Chronic pain syndrome: Secondary | ICD-10-CM | POA: Diagnosis not present

## 2019-08-26 DIAGNOSIS — G5701 Lesion of sciatic nerve, right lower limb: Secondary | ICD-10-CM | POA: Diagnosis not present

## 2019-10-04 ENCOUNTER — Other Ambulatory Visit: Payer: Self-pay | Admitting: Physician Assistant

## 2019-10-04 DIAGNOSIS — F988 Other specified behavioral and emotional disorders with onset usually occurring in childhood and adolescence: Secondary | ICD-10-CM

## 2019-10-06 ENCOUNTER — Encounter: Payer: Self-pay | Admitting: Physician Assistant

## 2019-10-06 ENCOUNTER — Telehealth (INDEPENDENT_AMBULATORY_CARE_PROVIDER_SITE_OTHER): Payer: BC Managed Care – PPO | Admitting: Physician Assistant

## 2019-10-06 VITALS — Ht 68.0 in | Wt 190.0 lb

## 2019-10-06 DIAGNOSIS — F988 Other specified behavioral and emotional disorders with onset usually occurring in childhood and adolescence: Secondary | ICD-10-CM | POA: Diagnosis not present

## 2019-10-06 DIAGNOSIS — F39 Unspecified mood [affective] disorder: Secondary | ICD-10-CM | POA: Diagnosis not present

## 2019-10-06 DIAGNOSIS — G479 Sleep disorder, unspecified: Secondary | ICD-10-CM | POA: Diagnosis not present

## 2019-10-06 DIAGNOSIS — F331 Major depressive disorder, recurrent, moderate: Secondary | ICD-10-CM

## 2019-10-06 MED ORDER — AMPHETAMINE-DEXTROAMPHETAMINE 20 MG PO TABS: 20.0000 mg | ORAL_TABLET | Freq: Two times a day (BID) | ORAL | 0 refills | Status: DC

## 2019-10-06 MED ORDER — LURASIDONE HCL 20 MG PO TABS: 20.0000 mg | ORAL_TABLET | Freq: Every day | ORAL | 2 refills | Status: DC

## 2019-10-06 MED ORDER — SERTRALINE HCL 100 MG PO TABS: 100.0000 mg | ORAL_TABLET | Freq: Every day | ORAL | 1 refills | Status: DC

## 2019-10-06 MED ORDER — TRAZODONE HCL 50 MG PO TABS: 25.0000 mg | ORAL_TABLET | Freq: Every evening | ORAL | 1 refills | Status: DC | PRN

## 2019-10-06 NOTE — Progress Notes (Signed)
Needs refills of Adderall. No other issues.  

## 2019-10-06 NOTE — Progress Notes (Signed)
Patient ID: Stacey Alvarado, female   DOB: [birth year], 49 y.o.   MRN: [id number]  .Marland KitchenVirtual Visit via Telephone Note  I connected with Stacey Alvarado on 10/06/2019 at  2:40 PM EDT by telephone and verified that I am speaking with the correct person using two identifiers.  Location: Patient: home Provider: clinic   I discussed the limitations, risks, security and privacy concerns of performing an evaluation and management service by telephone and the availability of in person appointments. I also discussed with the patient that there may be a patient responsible charge related to this service. The patient expressed understanding and agreed to proceed.   History of Present Illness: Pt is a 49 yo female with ADHD, MDD, mood disorder, anxiety who calls into the clinic for medication refills.   She is not doing great. She is still struggling with MDD and Anxiety. She is more depressed recently. She has no motivation. She has tried numerous medications and zoloft has worked the best. She does not get response at higher doses of zoloft and seems to make her more irritable. She wants to try something to add to this. She had read about latuda.   Focus is doing well. She denies any concerns with dose.    .. Active Ambulatory Problems    Diagnosis Date Noted  . Hypothyroidism 09/02/2007  . HYPERLIPIDEMIA 09/02/2007  . TOBACCO ABUSE 09/02/2007  . Depression 09/02/2007  . BACK PAIN, UPPER 05/30/2009  . DYSFUNCTION OF EUSTACHIAN TUBE 05/25/2010  . HAND PAIN 09/27/2010  . ADD (attention deficit disorder) 06/26/2013  . Cervical radiculitis 06/26/2013  . Multiple joint pain 07/07/2013  . 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 Vitamin D deficiency 05/07/2014  . Class 1 obesity due to excess calories without serious comorbidity in adult 02/14/2016  . No energy 08/13/2016  . B12 deficiency 08/14/2016  . Single skin nodule 12/10/2016  . Left hand paresthesia 12/10/2016  . Severe  episode of recurrent major depressive disorder, without psychotic features (HCC) 05/10/2019  . Mood disorder (HCC) 05/10/2019  . Trouble in sleeping 10/07/2019   Resolved Ambulatory Problems    Diagnosis Date Noted  . LOM 12/16/2007  . BRONCHITIS, ACUTE 01/08/2008  . URI 05/25/2010  . Essential hypertension 01/17/2015   No Additional Past Medical History   Reviewed med, allergies, and med list.   Observations/Objective: No acute distress.   .. Today's Vitals   10/06/19 1148  Weight: 190 lb (86.2 kg)  Height: 5\' 8"  (1.727 m)   Body mass index is 28.89 kg/m.  .. Depression screen [facility] 2/9 06/16/2019 05/07/2019 05/31/2017 03/08/2017 12/05/2016  Decreased Interest 3 3 3 3 3   Down, Depressed, Hopeless 3 2 2 2 3   PHQ - 2 Score 6 5 5 5 6   Altered sleeping 3 3 1 1  0  Tired, decreased energy 3 2 2 2 2   Change in appetite 0 0 2 3 3   Feeling bad or failure about yourself  3 3 2 2 2   Trouble concentrating 0 2 1 1 2   Moving slowly or fidgety/restless 0 0 1 0 1  Suicidal thoughts 0 0 0 0 0  PHQ-9 Score 15 15 14 14 16   Difficult doing work/chores Extremely dIfficult Very difficult Very difficult Somewhat difficult -   .. GAD 7 : Generalized Anxiety Score 06/16/2019 05/07/2019 05/31/2017 03/08/2017  Nervous, Anxious, on Edge 3 2 1 1   Control/stop worrying 2 1 1 1   Worry too much - different things 1 1 1  1  Trouble relaxing 3 3 1 1   Restless 3 2 1  0  Easily annoyed or irritable 3 3 2 3   Afraid - awful might happen 0 0 0 1  Total GAD 7 Score 15 12 7 8   Anxiety Difficulty Somewhat difficult Very difficult Somewhat difficult -       Assessment and Plan: Marland KitchenMarland KitchenRumaysa was seen today for adhd.  Diagnoses and all orders for this visit:  Mood disorder ([place])  Attention deficit disorder (ADD) without hyperactivity -     amphetamine-dextroamphetamine (ADDERALL) 20 MG tablet; Take 1 tablet (20 mg total) by mouth 2 (two) times daily. -     amphetamine-dextroamphetamine (ADDERALL) 20 MG  tablet; Take 1 tablet (20 mg total) by mouth 2 (two) times daily. -     amphetamine-dextroamphetamine (ADDERALL) 20 MG tablet; Take 1 tablet (20 mg total) by mouth 2 (two) times daily.  Moderate episode of recurrent major depressive disorder (HCC) -     sertraline (ZOLOFT) 100 MG tablet; Take 1 tablet (100 mg total) by mouth daily.  Trouble in sleeping -     traZODone (DESYREL) 50 MG tablet; Take 0.5-1 tablets (25-50 mg total) by mouth at bedtime as needed for sleep.  Other orders -     Discontinue: lurasidone (LATUDA) 20 MG TABS tablet; Take 1 tablet (20 mg total) by mouth daily. -     Brexpiprazole (REXULTI) 0.5 MG TABS; Take 1 tablet (0.5 mg total) by mouth daily.   Refilled medication.  Added latuda at patients request but suggested rexulti instead. She called back and agreed to rexulti. D/C latuda.  Continue zoloft.  Discussed side effects.  Follow up in 2 months.    Follow Up Instructions:    I discussed the assessment and treatment plan with the patient. The patient was provided an opportunity to ask questions and all were answered. The patient agreed with the plan and demonstrated an understanding of the instructions.   The patient was advised to call back or seek an in-person evaluation if the symptoms worsen or if the condition fails to improve as anticipated.  I provided 15 minutes of non-face-to-face time during this encounter.   Iran Planas, PA-C

## 2019-10-07 ENCOUNTER — Telehealth: Payer: Self-pay | Admitting: Physician Assistant

## 2019-10-07 DIAGNOSIS — G479 Sleep disorder, unspecified: Secondary | ICD-10-CM | POA: Insufficient documentation

## 2019-10-07 MED ORDER — REXULTI 0.5 MG PO TABS: 0.5000 mg | ORAL_TABLET | Freq: Every day | ORAL | 2 refills | Status: DC

## 2019-10-07 NOTE — Telephone Encounter (Signed)
Received fax for PA on [employer] sent through cover my meds and received authorization. - CF

## 2019-10-20 DIAGNOSIS — Z20822 Contact with and (suspected) exposure to covid-19: Secondary | ICD-10-CM | POA: Diagnosis not present

## 2019-10-20 DIAGNOSIS — Z03818 Encounter for observation for suspected exposure to other biological agents ruled out: Secondary | ICD-10-CM | POA: Diagnosis not present

## 2019-11-09 ENCOUNTER — Other Ambulatory Visit: Payer: Self-pay | Admitting: Neurology

## 2019-11-09 DIAGNOSIS — F39 Unspecified mood [affective] disorder: Secondary | ICD-10-CM

## 2019-11-09 DIAGNOSIS — G479 Sleep disorder, unspecified: Secondary | ICD-10-CM

## 2019-11-09 DIAGNOSIS — F331 Major depressive disorder, recurrent, moderate: Secondary | ICD-10-CM

## 2019-11-09 NOTE — Addendum Note (Signed)
Addended byLiliana Cline L on: 11/09/2019 10:20 AM   Modules accepted: Orders

## 2019-11-13 DIAGNOSIS — Z20822 Contact with and (suspected) exposure to covid-19: Secondary | ICD-10-CM | POA: Diagnosis not present

## 2019-11-17 DIAGNOSIS — Z20822 Contact with and (suspected) exposure to covid-19: Secondary | ICD-10-CM | POA: Diagnosis not present

## 2019-11-20 ENCOUNTER — Other Ambulatory Visit: Payer: Self-pay

## 2019-11-20 ENCOUNTER — Encounter: Payer: Self-pay | Admitting: Family Medicine

## 2019-11-20 ENCOUNTER — Ambulatory Visit: Payer: BC Managed Care – PPO | Admitting: Family Medicine

## 2019-11-20 VITALS — BP 135/85 | HR 96 | Temp 98.7°F | Wt 196.7 lb

## 2019-11-20 DIAGNOSIS — R197 Diarrhea, unspecified: Secondary | ICD-10-CM | POA: Diagnosis not present

## 2019-11-20 DIAGNOSIS — R509 Fever, unspecified: Secondary | ICD-10-CM | POA: Diagnosis not present

## 2019-11-20 LAB — COMPLETE METABOLIC PANEL WITH GFR
AG Ratio: 1.9 (calc) (ref 1.0–2.5)
ALT: 16 U/L (ref 6–29)
AST: 15 U/L (ref 10–35)
Albumin: 4.6 g/dL (ref 3.6–5.1)
Alkaline phosphatase (APISO): 73 U/L (ref 31–125)
BUN: 22 mg/dL (ref 7–25)
CO2: 28 mmol/L (ref 20–32)
Calcium: 9.8 mg/dL (ref 8.6–10.2)
Chloride: 104 mmol/L (ref 98–110)
Creat: 0.95 mg/dL (ref 0.50–1.10)
GFR, Est African American: 83 mL/min/{1.73_m2} (ref >=60)
GFR, Est Non African American: 71 mL/min/{1.73_m2} (ref >=60)
Globulin: 2.4 g/dL (calc) (ref 1.9–3.7)
Glucose, Bld: 99 mg/dL (ref 65–139)
Potassium: 4.3 mmol/L (ref 3.5–5.3)
Sodium: 140 mmol/L (ref 135–146)
Total Bilirubin: 0.3 mg/dL (ref 0.2–1.2)
Total Protein: 7 g/dL (ref 6.1–8.1)

## 2019-11-20 LAB — CBC
HCT: 40.7 % (ref 35.0–45.0)
Hemoglobin: 13.8 g/dL (ref 11.7–15.5)
MCH: 30.3 pg (ref 27.0–33.0)
MCHC: 33.9 g/dL (ref 32.0–36.0)
MCV: 89.5 fL (ref 80.0–100.0)
MPV: 11.5 fL (ref 7.5–12.5)
Platelets: 251 10*3/uL (ref 140–400)
RBC: 4.55 10*6/uL (ref 3.80–5.10)
RDW: 12.9 % (ref 11.0–15.0)
WBC: 6.6 10*3/uL (ref 3.8–10.8)

## 2019-11-20 MED ORDER — DICYCLOMINE HCL 10 MG PO CAPS: 10.0000 mg | ORAL_CAPSULE | Freq: Three times a day (TID) | ORAL | 0 refills | Status: DC

## 2019-11-20 NOTE — Assessment & Plan Note (Signed)
Likely viral however would expect some improvement at this point. COVID neg x2 per patient.  Will check CMP, CBC and stool studies.  Adding bentyl for cramping.  She can try immodium as needed as well.  Recommend increased fluid intake.  Red flags reviewed including signs of dehydration, blood in her stool, worsening fever, rash or other new symptoms.

## 2019-11-20 NOTE — Patient Instructions (Signed)

## 2019-11-20 NOTE — Progress Notes (Signed)
Stacey Alvarado - 49 y.o. female MRN [id number]  Date of birth: [birth year]  Subjective No chief complaint on file.   HPI Stacey Alvarado is a 49 y.o. female here today with complaint of GI upset.  Symptoms started about 10 days ago with fever and headache.  She developed diarrhea a couple of days later.  She has had some mild nausea but no vomiting.  She is having abdominal cramping throughout. She reports having several loose bowel movements each day, increased after eating.  She has not had blood in her stool.  She has continued to have elevated temp in the evening and headache as well.  She has had COVID test x2 which were negative.  She denies respiratory symptoms, insect bites or rash.    ROS:  A comprehensive ROS was completed and negative except as noted per HPI  Allergies  Allergen Reactions  . Belviq [Lorcaserin Hcl]     Emotional feelings.   Garvin Fila  [Hydromorphone]     Other reaction(s): nausea and fatigue  . Morphine   . Moxifloxacin Other (See Comments)    REACTION: itching and swelling of face Angioedema and Pruritis  . Sulfa Antibiotics Itching  . Sulfamethoxazole-Trimethoprim Hives and Itching  . Trintellix [Vortioxetine]     Made mood worse  . Wellbutrin [Bupropion]     insomnia  . Propoxyphene Nausea Only    History reviewed. No pertinent past medical history.  Past Surgical History:  Procedure Laterality Date  . ABDOMINAL HYSTERECTOMY    . CARPAL TUNNEL RELEASE    . CHOLECYSTECTOMY    . TONSILLECTOMY      Social History   Socioeconomic History  . Marital status: Married    Spouse name: Not on file  . Number of children: Not on file  . Years of education: Not on file  . Highest education level: Not on file  Occupational History  . Not on file  Tobacco Use  . Smoking status: Current Every Day Smoker    Types: Cigarettes  . Smokeless tobacco: Never Used  . Tobacco comment: vapor cigs  Substance and Sexual Activity  . Alcohol use: No  . Drug  use: Not on file  . Sexual activity: Not on file  Other Topics Concern  . Not on file  Social History Narrative  . Not on file   Social Determinants of Health   Financial Resource Strain:   . Difficulty of Paying Living Expenses:   Food Insecurity:   . Worried About Programme researcher, broadcasting/film/video in the Last Year:   . Barista in the Last Year:   Transportation Needs:   . Freight forwarder (Medical):   Marland Kitchen Lack of Transportation (Non-Medical):   Physical Activity:   . Days of Exercise per Week:   . Minutes of Exercise per Session:   Stress:   . Feeling of Stress :   Social Connections:   . Frequency of Communication with Friends and Family:   . Frequency of Social Gatherings with Friends and Family:   . Attends Religious Services:   . Active Member of Clubs or Organizations:   . Attends Banker Meetings:   Marland Kitchen Marital Status:     History reviewed. No pertinent family history.  Health Maintenance  Topic Date Due  . Hepatitis C Screening  Never done  . COVID-19 Vaccine (1) Never done  . HIV Screening  Never done  . PAP SMEAR-Modifier  Never done  . INFLUENZA  VACCINE  11/22/2019  . TETANUS/TDAP  07/12/2027     ----------------------------------------------------------------------------------------------------------------------------------------------------------------------------------------------------------------- Physical Exam BP (!) 135/85 (BP Location: Left Arm, Patient Position: Sitting, Cuff Size: Large)   Pulse 96   Temp 98.7 F (37.1 C) (Oral)   Wt 196 lb 11.2 oz (89.2 kg)   SpO2 98%   BMI 29.91 kg/m   Physical Exam Constitutional:      Appearance: Normal appearance.  HENT:     Head: Normocephalic and atraumatic.  Eyes:     General: No scleral icterus. Cardiovascular:     Rate and Rhythm: Normal rate and regular rhythm.  Pulmonary:     Effort: Pulmonary effort is normal.     Breath sounds: Normal breath sounds.  Abdominal:      General: Abdomen is flat.     Palpations: Abdomen is soft.     Tenderness: There is abdominal tenderness (mild ttp throughout. ).  Musculoskeletal:     Cervical back: Neck supple.  Skin:    General: Skin is warm and dry.  Neurological:     General: No focal deficit present.     Mental Status: She is alert.  Psychiatric:        Mood and Affect: Mood normal.        Behavior: Behavior normal.     ------------------------------------------------------------------------------------------------------------------------------------------------------------------------------------------------------------------- Assessment and Plan  Diarrhea Likely viral however would expect some improvement at this point. COVID neg x2 per patient.  Will check CMP, CBC and stool studies.  Adding bentyl for cramping.  She can try immodium as needed as well.  Recommend increased fluid intake.  Red flags reviewed including signs of dehydration, blood in her stool, worsening fever, rash or other new symptoms.     Meds ordered this encounter  Medications  . dicyclomine (BENTYL) 10 MG capsule    Sig: Take 1 capsule (10 mg total) by mouth with breakfast, with lunch, and with evening meal. Take as needed.    Dispense:  30 capsule    Refill:  0    No follow-ups on file.    This visit occurred during the SARS-CoV-2 public health emergency.  Safety protocols were in place, including screening questions prior to the visit, additional usage of staff PPE, and extensive cleaning of exam room while observing appropriate contact time as indicated for disinfecting solutions.

## 2019-11-30 DIAGNOSIS — G5701 Lesion of sciatic nerve, right lower limb: Secondary | ICD-10-CM | POA: Diagnosis not present

## 2019-11-30 DIAGNOSIS — M5136 Other intervertebral disc degeneration, lumbar region: Secondary | ICD-10-CM | POA: Diagnosis not present

## 2019-11-30 DIAGNOSIS — M129 Arthropathy, unspecified: Secondary | ICD-10-CM | POA: Diagnosis not present

## 2019-11-30 DIAGNOSIS — Z79891 Long term (current) use of opiate analgesic: Secondary | ICD-10-CM | POA: Diagnosis not present

## 2019-11-30 DIAGNOSIS — G894 Chronic pain syndrome: Secondary | ICD-10-CM | POA: Diagnosis not present

## 2019-12-07 ENCOUNTER — Encounter: Payer: Self-pay | Admitting: Physician Assistant

## 2019-12-08 MED ORDER — BREXPIPRAZOLE 1 MG PO TABS: 1.0000 mg | ORAL_TABLET | Freq: Every day | ORAL | 1 refills | Status: DC

## 2020-01-12 ENCOUNTER — Telehealth (INDEPENDENT_AMBULATORY_CARE_PROVIDER_SITE_OTHER): Payer: BC Managed Care – PPO | Admitting: Physician Assistant

## 2020-01-12 VITALS — Ht 68.0 in | Wt 196.0 lb

## 2020-01-12 DIAGNOSIS — F988 Other specified behavioral and emotional disorders with onset usually occurring in childhood and adolescence: Secondary | ICD-10-CM | POA: Diagnosis not present

## 2020-01-12 DIAGNOSIS — F39 Unspecified mood [affective] disorder: Secondary | ICD-10-CM

## 2020-01-12 DIAGNOSIS — F332 Major depressive disorder, recurrent severe without psychotic features: Secondary | ICD-10-CM

## 2020-01-12 MED ORDER — AMPHETAMINE-DEXTROAMPHETAMINE 20 MG PO TABS: 20.0000 mg | ORAL_TABLET | Freq: Two times a day (BID) | ORAL | 0 refills | Status: DC

## 2020-01-12 MED ORDER — BUPROPION HCL ER (SR) 150 MG PO TB12: 150.0000 mg | ORAL_TABLET | Freq: Two times a day (BID) | ORAL | 2 refills | Status: DC

## 2020-01-12 NOTE — Progress Notes (Signed)
Patient ID: Stacey Alvarado, female   DOB: 1970/12/03, 49 y.o.   MRN: [id number] .Marland KitchenVirtual Visit via Video Note  I connected with Stacey Alvarado on 01/12/2020 at 10:50 AM EDT by a video enabled telemedicine application and verified that I am speaking with the correct person using two identifiers.  Location: Patient: car Provider: clinic   I discussed the limitations of evaluation and management by telemedicine and the availability of in person appointments. The patient expressed understanding and agreed to proceed.  History of Present Illness: Patient is a 49 year old female with ADD, MDD, mood disorder who presents to the clinic for medication refill.  Patient is doing well on Adderall.  She has no concerns or complaints.  She denies any increase in anxiety or problems sleeping.  She is much more productive at work and overall this makes her quality of life better.  She is still struggling significantly with depression.  She has little to no pleasure in doing things.  She is on Zoloft which she has been for years.  She has tried multiple medications.  Some medications seem to work at first but then they quickly waned.  The most recent was resulting which she had a great initial response but then these effects waned.  She also gained a significant amount of weight.  She is desperate to find something that could help.  She tries to think that at the point in her life where she was the happiest.  She thinks she could have been on Wellbutrin.  She would like to try again.  .. Active Ambulatory Problems    Diagnosis Date Noted  . Hypothyroidism 09/02/2007  . HYPERLIPIDEMIA 09/02/2007  . TOBACCO ABUSE 09/02/2007  . Depression 09/02/2007  . BACK PAIN, UPPER 05/30/2009  . DYSFUNCTION OF EUSTACHIAN TUBE 05/25/2010  . HAND PAIN 09/27/2010  . ADD (attention deficit disorder) 06/26/2013  . Cervical radiculitis 06/26/2013  . Multiple joint pain 07/07/2013  . Chondromalacia of right patellofemoral  joint 08/28/2013  . Vitamin D deficiency 05/07/2014  . Class 1 obesity due to excess calories without serious comorbidity in adult 02/14/2016  . No energy 08/13/2016  . B12 deficiency 08/14/2016  . Single skin nodule 12/10/2016  . Left hand paresthesia 12/10/2016  . Severe episode of recurrent major depressive disorder, without psychotic features (HCC) 05/10/2019  . Mood disorder (HCC) 05/10/2019  . Trouble in sleeping 10/07/2019  . Diarrhea 11/20/2019   Resolved Ambulatory Problems    Diagnosis Date Noted  . LOM 12/16/2007  . BRONCHITIS, ACUTE 01/08/2008  . URI 05/25/2010  . Essential hypertension 01/17/2015   No Additional Past Medical History   Reviewed med, allergy, problem list.    Observations/Objective: No acute distress Normal mood and appearance No labored breathing  .Marland Kitchen Today's Vitals   01/12/20 1049  Weight: 196 lb (88.9 kg)  Height: 5\' 8"  (1.727 m)   Body mass index is 29.8 kg/m.   .. Depression screen [facility] 2/9 01/12/2020 06/16/2019 05/07/2019 05/31/2017 03/08/2017  Decreased Interest 3 3 3 3 3   Down, Depressed, Hopeless 3 3 2 2 2   PHQ - 2 Score 6 6 5 5 5   Altered sleeping 0 3 3 1 1   Tired, decreased energy 3 3 2 2 2   Change in appetite 3 0 0 2 3  Feeling bad or failure about yourself  3 3 3 2 2   Trouble concentrating 2 0 2 1 1   Moving slowly or fidgety/restless 0 0 0 1 0  Suicidal thoughts 0 0  0 0 0  PHQ-9 Score 17 15 15 14 14   Difficult doing work/chores Very difficult Extremely dIfficult Very difficult Very difficult Somewhat difficult   . GAD 7 : Generalized Anxiety Score 01/12/2020 06/16/2019 05/07/2019 05/31/2017  Nervous, Anxious, on Edge 2 3 2 1   Control/stop worrying 1 2 1 1   Worry too much - different things 0 1 1 1   Trouble relaxing 3 3 3 1   Restless 1 3 2 1   Easily annoyed or irritable 3 3 3 2   Afraid - awful might happen 2 0 0 0  Total GAD 7 Score 12 15 12 7   Anxiety Difficulty Very difficult Somewhat difficult Very difficult Somewhat  difficult     Assessment and Plan: 07/29/2017 Stacey Alvarado was seen today for mental health problem and adhd.  Diagnoses and all orders for this visit:  Attention deficit disorder (ADD) without hyperactivity -     amphetamine-dextroamphetamine (ADDERALL) 20 MG tablet; Take 1 tablet (20 mg total) by mouth 2 (two) times daily. -     amphetamine-dextroamphetamine (ADDERALL) 20 MG tablet; Take 1 tablet (20 mg total) by mouth 2 (two) times daily. -     amphetamine-dextroamphetamine (ADDERALL) 20 MG tablet; Take 1 tablet (20 mg total) by mouth 2 (two) times daily.  Severe episode of recurrent major depressive disorder, without psychotic features (HCC) -     buPROPion (WELLBUTRIN SR) 150 MG 12 hr tablet; Take 1 tablet (150 mg total) by mouth 2 (two) times daily.  Mood disorder (HCC)   PHQ and GAD numbers are not controlled.  I did add Wellbutrin but discussed she had put this caused insomnia in the past.  Please watch out for this.  She is on sleep medicine which she was not before.  I do think we could consider [facility] testing with patient.  Placed order to get her set up for this.  Maybe this can give some insight on what she would best respond to.  Refilled Adderall for 3 months.    Follow Up Instructions:    I discussed the assessment and treatment plan with the patient. The patient was provided an opportunity to ask questions and all were answered. The patient agreed with the plan and demonstrated an understanding of the instructions.   The patient was advised to call back or seek an in-person evaluation if the symptoms worsen or if the condition fails to improve as anticipated.  I provided 10 minutes of non-face-to-face time during this encounter.   , PA-C

## 2020-01-12 NOTE — Progress Notes (Signed)
Needs refills of Adderall. PHQ9 (17) -GAD7 (12) completed.

## 2020-01-13 ENCOUNTER — Encounter: Payer: Self-pay | Admitting: Physician Assistant

## 2020-02-03 ENCOUNTER — Ambulatory Visit: Payer: BC Managed Care – PPO

## 2020-04-19 ENCOUNTER — Encounter: Payer: Self-pay | Admitting: Physician Assistant

## 2020-04-19 ENCOUNTER — Telehealth (INDEPENDENT_AMBULATORY_CARE_PROVIDER_SITE_OTHER): Payer: Self-pay | Admitting: Physician Assistant

## 2020-04-19 DIAGNOSIS — F331 Major depressive disorder, recurrent, moderate: Secondary | ICD-10-CM

## 2020-04-19 DIAGNOSIS — F988 Other specified behavioral and emotional disorders with onset usually occurring in childhood and adolescence: Secondary | ICD-10-CM

## 2020-04-19 DIAGNOSIS — F39 Unspecified mood [affective] disorder: Secondary | ICD-10-CM

## 2020-04-19 MED ORDER — AMPHETAMINE-DEXTROAMPHETAMINE 20 MG PO TABS: 20.0000 mg | ORAL_TABLET | Freq: Two times a day (BID) | ORAL | 0 refills | Status: DC

## 2020-04-19 MED ORDER — SERTRALINE HCL 100 MG PO TABS: 100.0000 mg | ORAL_TABLET | Freq: Every day | ORAL | 1 refills | Status: DC

## 2020-04-19 NOTE — Progress Notes (Signed)
.Virtual Visit via Telephone Note  I connected with Stacey Alvarado on 04/19/20 at  8:30 AM EST by video and verified that I am speaking with the correct person using two identifiers.  Location: Patient: car Provider: clinic  .Marland KitchenParticipating in visit:  Patient: Pollie Poma Provider:Jensyn Shave Caleen Essex PA-C   I discussed the limitations, risks, security and privacy concerns of performing an evaluation and management service by telephone and the availability of in person appointments. I also discussed with the patient that there may be a patient responsible charge related to this service. The patient expressed understanding and agreed to proceed.   History of Present Illness: Pt is a 49 yo female with ADD, MDD, GAD who calls into the clinic for medication refills. wellbutin made her not able to go to sleep. She stopped it. She continues zoloft. Does feel like helping her mood. She continues to be low. No sI/HC. Self pay and cannot afford gene sight testing.   Tried and failed many different BH combinations. Pt self pay declines BH referral. She also does not want to gain weight.   Adderall no problems with focus or mood.    .. Active Ambulatory Problems    Diagnosis Date Noted  . Hypothyroidism 09/02/2007  . HYPERLIPIDEMIA 09/02/2007  . TOBACCO ABUSE 09/02/2007  . Depression 09/02/2007  . BACK PAIN, UPPER 05/30/2009  . DYSFUNCTION OF EUSTACHIAN TUBE 05/25/2010  . HAND PAIN 09/27/2010  . ADD (attention deficit disorder) 06/26/2013  . Cervical radiculitis 06/26/2013  . Multiple joint pain 07/07/2013  . 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 Vitamin D deficiency 05/07/2014  . Class 1 obesity due to excess calories without serious comorbidity in adult 02/14/2016  . No energy 08/13/2016  . B12 deficiency 08/14/2016  . Single skin nodule 12/10/2016  . Left hand paresthesia 12/10/2016  . Severe episode of recurrent major depressive disorder, without psychotic features  (HCC) 05/10/2019  . Mood disorder (HCC) 05/10/2019  . Trouble in sleeping 10/07/2019  . Diarrhea 11/20/2019   Resolved Ambulatory Problems    Diagnosis Date Noted  . LOM 12/16/2007  . BRONCHITIS, ACUTE 01/08/2008  . URI 05/25/2010  . Essential hypertension 01/17/2015   No Additional Past Medical History   Reviewed med, allergy, problem list.  Observations/Objective: No acute distress Normal breathing.  Normal mood.   .. Today's Vitals   04/19/20 0817  Weight: 195 lb (88.5 kg)  Height: 5\' 8"  (1.727 m)   Body mass index is 29.65 kg/m.  .. Depression screen [facility] 2/9 01/12/2020 06/16/2019 05/07/2019 05/31/2017 03/08/2017  Decreased Interest 3 3 3 3 3   Down, Depressed, Hopeless 3 3 2 2 2   PHQ - 2 Score 6 6 5 5 5   Altered sleeping 0 3 3 1 1   Tired, decreased energy 3 3 2 2 2   Change in appetite 3 0 0 2 3  Feeling bad or failure about yourself  3 3 3 2 2   Trouble concentrating 2 0 2 1 1   Moving slowly or fidgety/restless 0 0 0 1 0  Suicidal thoughts 0 0 0 0 0  PHQ-9 Score 17 15 15 14 14   Difficult doing work/chores Very difficult Extremely dIfficult Very difficult Very difficult Somewhat difficult   .03/10/2017 GAD 7 : Generalized Anxiety Score 01/12/2020 06/16/2019 05/07/2019 05/31/2017  Nervous, Anxious, on Edge 2 3 2 1   Control/stop worrying 1 2 1 1   Worry too much - different things 0 1 1 1   Trouble relaxing 3 3 3 1   Restless  1 3 2 1   Easily annoyed or irritable 3 3 3 2   Afraid - awful might happen 2 0 0 0  Total GAD 7 Score 12 15 12 7   Anxiety Difficulty Very difficult Somewhat difficult Very difficult Somewhat difficult     Assessment and Plan:  Tamira was seen today for medication refill.  Diagnoses and all orders for this visit:  Moderate episode of recurrent major depressive disorder (HCC) -     sertraline (ZOLOFT) 100 MG tablet; Take 1 tablet (100 mg total) by mouth daily.  Mood disorder (HCC) -     sertraline (ZOLOFT) 100 MG tablet; Take 1 tablet (100 mg total) by  mouth daily.  Attention deficit disorder (ADD) without hyperactivity -     amphetamine-dextroamphetamine (ADDERALL) 20 MG tablet; Take 1 tablet (20 mg total) by mouth 2 (two) times daily. -     amphetamine-dextroamphetamine (ADDERALL) 20 MG tablet; Take 1 tablet (20 mg total) by mouth 2 (two) times daily. -     amphetamine-dextroamphetamine (ADDERALL) 20 MG tablet; Take 1 tablet (20 mg total) by mouth 2 (two) times daily.   Medications refilled.  Mood stable.  ADD controlled. Follow up in 6 months.   Follow Up Instructions:    I discussed the assessment and treatment plan with the patient. The patient was provided an opportunity to ask questions and all were answered. The patient agreed with the plan and demonstrated an understanding of the instructions.   The patient was advised to call back or seek an in-person evaluation if the symptoms worsen or if the condition fails to improve as anticipated.   Marland Kitchen, PA-C

## 2020-05-23 ENCOUNTER — Encounter: Payer: Self-pay | Admitting: Nurse Practitioner

## 2020-05-23 ENCOUNTER — Telehealth (INDEPENDENT_AMBULATORY_CARE_PROVIDER_SITE_OTHER): Payer: HRSA Program | Admitting: Nurse Practitioner

## 2020-05-23 VITALS — Temp 99.0°F

## 2020-05-23 DIAGNOSIS — U071 COVID-19: Secondary | ICD-10-CM

## 2020-05-23 DIAGNOSIS — J1282 Pneumonia due to coronavirus disease 2019: Secondary | ICD-10-CM | POA: Diagnosis not present

## 2020-05-23 MED ORDER — ALBUTEROL SULFATE HFA 108 (90 BASE) MCG/ACT IN AERS: 1.0000 | INHALATION_SPRAY | Freq: Four times a day (QID) | RESPIRATORY_TRACT | 1 refills | Status: DC | PRN

## 2020-05-23 MED ORDER — AMOXICILLIN-POT CLAVULANATE 875-125 MG PO TABS: 1.0000 | ORAL_TABLET | Freq: Two times a day (BID) | ORAL | 0 refills | Status: DC

## 2020-05-23 MED ORDER — PREDNISONE 50 MG PO TABS
50.0000 mg | ORAL_TABLET | Freq: Every day | ORAL | 0 refills | Status: DC
Start: 2020-05-23 — End: 2021-01-18

## 2020-05-23 MED ORDER — AZITHROMYCIN 250 MG PO TABS: ORAL_TABLET | ORAL | 0 refills | Status: AC

## 2020-05-23 NOTE — Progress Notes (Signed)
Virtual Video Visit via MyChart Note  I connected with  Stacey Alvarado on 05/23/20 at  2:10 PM EST by the video enabled telemedicine application for , MyChart, and verified that I am speaking with the correct person using two identifiers.   I introduced myself as a Publishing rights manager with the practice. We discussed the limitations of evaluation and management by telemedicine and the availability of in person appointments. The patient expressed understanding and agreed to proceed.  Participating parties in this visit include: The patient and the nurse practitioner listed.  The patient is: At home I am: In the office  Subjective:    CC:  Chief Complaint  Patient presents with  . Covid Positive    Tested positive 2 weeks ago, ongoing cough, wheezing, ShOB with exertion, HA with exertion, not taking anything to help with the sx    HPI: Stacey Alvarado is a 50 y.o. year old female presenting today via MyChart today for COVID symptoms that have been ongoing for about 2.5 weeks. She was diagnosed with COVID 2 weeks ago and reports that she does have significant symptom improvement, but she is still suffering from some symptoms.   She can hear crackling in her lungs and is getting short of breath with minimal exertion. She endorses a cough that varies from dry to yellow/brown mucous, wheezing, and fatigue.   She denies fevers, chills, sinus pain or pressure, body aches.   She tells me that her workplace requires a negative COVID test to return to work and her test over the weekend remained positive. She is frustrated that she must remain out of work unpaid.   Past medical history, Surgical history, Family history not pertinant except as noted below, Social history, Allergies, and medications have been entered into the medical record, reviewed, and corrections made.   Review of Systems:  All review of systems negative except what is listed in the HPI   Objective:    General:  Speaking  clearly in complete sentences. Mild shortness of breath noted.   Alert and oriented x3.   Normal judgment.  Absent acute distress. She is audibly congested  Impression and Recommendations:    1. Pneumonia due to COVID-19 virus Symptoms and presentation consistent with CAP associated with COVID-19. Patient is self-pay, therefore we will avoid x-ray today and begin treatment for atypical bacteria for suspected pneumonia. Will also treat with a steroid burst for shortness of breath and wheezing and add albuterol inhaler.  Work note provided advising that return to work should be based on improvement of symptoms and lack of fever for greater than 24 hours rather than negative COVID test. Recommend return to work 05/25/2020 or 05/26/2020 based on symptom improvement and recovery to allow additional 2 days for antibiotic treatment to take effect.  If additional time is needed, patient will notify the office.   - amoxicillin-clavulanate (AUGMENTIN) 875-125 MG tablet; Take 1 tablet by mouth 2 (two) times daily.  Dispense: 10 tablet; Refill: 0 - azithromycin (ZITHROMAX) 250 MG tablet; 2 tabs PO on Day 1, then one a day x 4 days.  Dispense: 6 tablet; Refill: 0 - albuterol (VENTOLIN HFA) 108 (90 Base) MCG/ACT inhaler; Inhale 1-2 puffs into the lungs every 6 (six) hours as needed for wheezing or shortness of breath.  Dispense: 6.7 g; Refill: 1 - predniSONE (DELTASONE) 50 MG tablet; Take 1 tablet (50 mg total) by mouth daily.  Dispense: 5 tablet; Refill: 0   Follow-up if symptoms worsen or fail  to improve.    I discussed the assessment and treatment plan with the patient. The patient was provided an opportunity to ask questions and all were answered. The patient agreed with the plan and demonstrated an understanding of the instructions.   The patient was advised to call back or seek an in-person evaluation if the symptoms worsen or if the condition fails to improve as anticipated.  I provided 20 minutes  of non-face-to-face interaction with this MYCHART visit including intake, same-day documentation, and chart review.   Tollie Eth, NP

## 2020-05-23 NOTE — Patient Instructions (Addendum)
I recommend trying Mucinex, available over the counter, for the increased mucous production and cough. Flonase can also be significantly beneficial for symptom management.   If you begin to feel severely short of breath, dizzy, lightheaded, or symptoms worsen, please let us know immediately.   As long as you are not running a fever, you may safely return to work 02/02-02/03 if you are feeling better.   Community-Acquired Pneumonia, Adult Pneumonia is an infection of the lungs. It causes irritation and swelling in the airways of the lungs. Mucus and fluid may also build up inside the airways. This may cause coughing and trouble breathing. One type of pneumonia can happen while you are in a hospital. A different type can happen when you are not in a hospital (community-acquired pneumonia). What are the causes? This condition is caused by germs (viruses, bacteria, or fungi). Some types of germs can spread from person to person. Pneumonia is not thought to spread from person to person.   What increases the risk? You are more likely to develop this condition if:  You have a long-term (chronic) disease, such as: ? Disease of the lungs. This may be chronic obstructive pulmonary disease (COPD) or asthma. ? Heart failure. ? Cystic fibrosis. ? Diabetes. ? Kidney disease. ? Sickle cell disease. ? HIV.  You have other health problems, such as: ? Your body's defense system (immune system) is weak. ? A condition that may cause you to breathe in fluids from your mouth and nose.  You had your spleen taken out.  You do not take good care of your teeth and mouth (poor dental hygiene).  You use or have used tobacco products.  You travel where the germs that cause this illness are common.  You are near certain animals or the places they live.  You are older than 49 years of age. What are the signs or symptoms? Symptoms of this condition include:  A cough.  A fever.  Sweating or  chills.  Chest pain, often when you breathe deeply or cough.  Breathing problems, such as: ? Fast breathing. ? Trouble breathing. ? Shortness of breath.  Feeling tired (fatigued).  Muscle aches. How is this treated? Treatment for this condition depends on many things, such as:  The cause of your illness.  Your medicines.  Your other health problems. Most adults can be treated at home. Sometimes, treatment must happen in a hospital.  Treatment may include medicines to kill germs.  Medicines may depend on which germ caused your illness. Very bad pneumonia is rare. If you get it, you may:  Have a machine to help you breathe.  Have fluid taken away from around your lungs. Follow these instructions at home: Medicines  Take over-the-counter and prescription medicines only as told by your doctor.  Take cough medicine only if you are losing sleep. Cough medicine can keep your body from taking mucus away from your lungs.  If you were prescribed an antibiotic medicine, take it as told by your doctor. Do not stop taking the antibiotic even if you start to feel better. Lifestyle  Do not drink alcohol.  Do not use any products that contain nicotine or tobacco, such as cigarettes, e-cigarettes, and chewing tobacco. If you need help quitting, ask your doctor.  Eat a healthy diet. This includes a lot of vegetables, fruits, whole grains, low-fat dairy products, and low-fat (lean) protein.      General instructions  Rest a lot. Sleep for at least 8 hours  each night.  Sleep with your head and neck raised. Put a few pillows under your head or sleep in a reclining chair.  Return to your normal activities as told by your doctor. Ask your doctor what activities are safe for you.  Drink enough fluid to keep your pee (urine) pale yellow.  If your throat is sore, rinse your mouth often with salt water. To make salt water, dissolve -1 tsp (3-6 g) of salt in 1 cup (237 mL) of warm  water.  Keep all follow-up visits as told by your doctor. This is important.   How is this prevented? You can lower your risk of pneumonia by:  Getting the pneumonia shot (vaccine). These shots have different types and schedules. Ask your doctor what works best for you. Think about getting this shot if: ? You are older than 49 years of age. ? You are 28-13 years of age and:  You are being treated for cancer.  You have long-term lung disease.  You have other problems that affect your body's defense system. Ask your doctor if you have one of these.  Getting your flu shot every year. Ask your doctor which type of shot is best for you.  Going to the dentist as often as told.  Washing your hands often with soap and water for at least 20 seconds. If you cannot use soap and water, use hand sanitizer. Contact a doctor if:  You have a fever.  You lose sleep because your cough medicine does not help. Get help right away if:  You are short of breath and this gets worse.  You have more chest pain.  Your sickness gets worse. This is very serious if: ? You are an older adult. ? Your body's defense system is weak.  You cough up blood. These symptoms may be an emergency. Do not wait to see if the symptoms will go away. Get medical help right away. Call your local emergency services (911 in the [place] Do not drive yourself to the hospital. Summary  Pneumonia is an infection of the lungs.  Community-acquired pneumonia affects people who have not been in the hospital. Certain germs can cause this infection.  This condition may be treated with medicines that kill germs.  For very bad pneumonia, you may need a hospital stay and treatment to help with breathing. This information is not intended to replace advice given to you by your health care provider. Make sure you discuss any questions you have with your health care provider. Document Revised: 01/20/2019 Document Reviewed:  01/20/2019 Elsevier Patient Education  2021 [employer].

## 2020-05-27 ENCOUNTER — Encounter: Payer: Self-pay | Admitting: Medical-Surgical

## 2020-05-27 ENCOUNTER — Encounter: Payer: Self-pay | Admitting: Nurse Practitioner

## 2020-07-26 ENCOUNTER — Encounter: Payer: Self-pay | Admitting: Physician Assistant

## 2020-07-26 DIAGNOSIS — F988 Other specified behavioral and emotional disorders with onset usually occurring in childhood and adolescence: Secondary | ICD-10-CM

## 2020-07-26 MED ORDER — AMPHETAMINE-DEXTROAMPHETAMINE 20 MG PO TABS: 20.0000 mg | ORAL_TABLET | Freq: Two times a day (BID) | ORAL | 0 refills | Status: DC

## 2020-07-26 MED ORDER — AMPHETAMINE-DEXTROAMPHETAMINE 20 MG PO TABS
20.0000 mg | ORAL_TABLET | Freq: Two times a day (BID) | ORAL | 0 refills | Status: DC
Start: 2020-07-26 — End: 2020-10-27

## 2020-09-27 ENCOUNTER — Encounter: Payer: Self-pay | Admitting: Physician Assistant

## 2020-09-27 NOTE — Telephone Encounter (Signed)
Ok for letter that pt has been diagnosed with ADD and prescribed amphetamines in the form on Adderall? I think? Adderall is expected to show up on drug screen as amephatamine. Pt follows up every 3-6 months for monitoring.

## 2020-09-27 NOTE — Telephone Encounter (Signed)
Letter written and given to the front desk to email to patient.  ?

## 2020-10-27 ENCOUNTER — Other Ambulatory Visit: Payer: Self-pay | Admitting: Physician Assistant

## 2020-10-27 DIAGNOSIS — F988 Other specified behavioral and emotional disorders with onset usually occurring in childhood and adolescence: Secondary | ICD-10-CM

## 2020-10-28 MED ORDER — AMPHETAMINE-DEXTROAMPHETAMINE 20 MG PO TABS: 20.0000 mg | ORAL_TABLET | Freq: Two times a day (BID) | ORAL | 0 refills | Status: DC

## 2020-11-02 ENCOUNTER — Telehealth: Payer: Self-pay

## 2020-11-02 NOTE — Telephone Encounter (Signed)
X received from [facility] indicating a PA was needed for Adderall 20mg . Using Covermymeds, a PA was completed and submitted; awaiting response.

## 2020-11-04 NOTE — Telephone Encounter (Signed)
Fax received that medication was denied through insurance. Information fgiven to provider to decide on next clinical steps.

## 2020-11-07 NOTE — Telephone Encounter (Signed)
Left msg for a return call regarding a PA.

## 2020-11-15 NOTE — Telephone Encounter (Signed)
LVM for pt to call to discuss.  T. Vir Whetstine, CMA  

## 2020-11-16 NOTE — Telephone Encounter (Signed)
LVM informing pt to use [facility] to have Adderall filled.  Also sent pt MyChart message with this information.  Tiajuana Amass, CMA

## 2020-11-23 ENCOUNTER — Telehealth (INDEPENDENT_AMBULATORY_CARE_PROVIDER_SITE_OTHER): Payer: 59 | Admitting: Medical-Surgical

## 2020-11-23 ENCOUNTER — Encounter: Payer: Self-pay | Admitting: Medical-Surgical

## 2020-11-23 VITALS — Wt 185.0 lb

## 2020-11-23 DIAGNOSIS — F988 Other specified behavioral and emotional disorders with onset usually occurring in childhood and adolescence: Secondary | ICD-10-CM

## 2020-11-23 DIAGNOSIS — J0100 Acute maxillary sinusitis, unspecified: Secondary | ICD-10-CM

## 2020-11-23 DIAGNOSIS — Z20822 Contact with and (suspected) exposure to covid-19: Secondary | ICD-10-CM

## 2020-11-23 MED ORDER — AZITHROMYCIN 250 MG PO TABS: ORAL_TABLET | ORAL | 0 refills | Status: AC

## 2020-11-23 MED ORDER — AMPHETAMINE-DEXTROAMPHETAMINE 20 MG PO TABS: 20.0000 mg | ORAL_TABLET | Freq: Two times a day (BID) | ORAL | 0 refills | Status: DC

## 2020-11-23 NOTE — Progress Notes (Signed)
Covid in January, not currently Covid positive Works from home Feels fatigued, headaches

## 2020-11-23 NOTE — Progress Notes (Signed)
Virtual Visit via Video Note  I connected with Stacey Alvarado on 11/23/20 at  4:00 PM EDT by a video enabled telemedicine application and verified that I am speaking with the correct person using two identifiers.   I discussed the limitations of evaluation and management by telemedicine and the availability of in person appointments. The patient expressed understanding and agreed to proceed.  Patient location: home Provider locations: office  Subjective:    CC: COVID symptoms but needs note to return to work  HPI: Pleasant 50 year old female presenting via MyChart video visit to discuss 1 week of COVID like symptoms. Notes that she started feeling bad last week around Thursday with sore throat, fatigue, body aches, intermittent low grade fever, and severe headache. She has taken 2 COVID home tests with negative results but notes her symptoms feel just like when she had symptoms back in January. No rashes, shortness of breath, or chest pain. She works from home and is in a new job on a probationary period. 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 She needs a work note to be able to return to work without corrective action for her absence.   Is overdue for her ADHD follow up with her PCP and was given a 30 day supply to allow her time to get in for a follow up. Unfortunately, she works Monday through Friday and cannot miss work in her first 90 days. She is asking for a refill to give her time to get in for an appointment. Taking the medication as prescribed with no side effects. Feels that it works well for her. No change in appetite, weight, or sleeping patterns.  Past medical history, Surgical history, Family history not pertinant except as noted below, Social history, Allergies, and medications have been entered into the medical record, reviewed, and corrections made.   Review of Systems: See HPI for pertinent  positives and negatives.   Objective:    General: Speaking clearly in complete sentences without any shortness of breath.  Alert and oriented x3.  Normal judgment. No apparent acute distress.  Impression and Recommendations:    1. Acute non-recurrent maxillary sinusitis Symptoms do appear consistent with COVID-19 infection however she has had 2 negative home test.  Since she has had about 1 week of symptoms without relief, we will go ahead and treat for acute sinusitis with azithromycin.  Letter sent for return to work on 11/25/2020.  2. Attention deficit disorder (ADD) without hyperactivity Patient aware of the importance of following up with her PCP appropriately.  Since she is in the 90-day probationary window, I have agreed to go ahead and send in a 30-day supply for her but she will need to see her PCP for any further refills from our practice.  Patient is aware and verbalized understanding.  I discussed the assessment and treatment plan with the patient. The patient was provided an opportunity to ask questions and all were answered. The patient agreed with the plan and demonstrated an understanding of the instructions.   The patient was advised to call back or seek an in-person evaluation if the symptoms worsen or if the condition fails to improve as anticipated.  20 minutes of non-face-to-face time was provided during this encounter.  Return for ADHD follow-up as soon as possible.  Thayer Ohm, DNP, APRN, FNP-BC [facility]

## 2021-01-04 ENCOUNTER — Telehealth: Payer: Self-pay

## 2021-01-04 NOTE — Telephone Encounter (Signed)
Medication: amphetamine-dextroamphetamine (ADDERALL) 20 MG tablet Prior authorization submitted via CoverMyMeds on 01/04/2021 PA submission pending

## 2021-01-18 ENCOUNTER — Telehealth (INDEPENDENT_AMBULATORY_CARE_PROVIDER_SITE_OTHER): Payer: 59 | Admitting: Physician Assistant

## 2021-01-18 ENCOUNTER — Encounter: Payer: Self-pay | Admitting: Physician Assistant

## 2021-01-18 VITALS — Temp 100.2°F | Ht 68.0 in | Wt 190.0 lb

## 2021-01-18 DIAGNOSIS — F331 Major depressive disorder, recurrent, moderate: Secondary | ICD-10-CM | POA: Diagnosis not present

## 2021-01-18 DIAGNOSIS — F988 Other specified behavioral and emotional disorders with onset usually occurring in childhood and adolescence: Secondary | ICD-10-CM | POA: Diagnosis not present

## 2021-01-18 DIAGNOSIS — F39 Unspecified mood [affective] disorder: Secondary | ICD-10-CM

## 2021-01-18 DIAGNOSIS — Z1231 Encounter for screening mammogram for malignant neoplasm of breast: Secondary | ICD-10-CM

## 2021-01-18 DIAGNOSIS — J069 Acute upper respiratory infection, unspecified: Secondary | ICD-10-CM

## 2021-01-18 MED ORDER — AMPHETAMINE-DEXTROAMPHETAMINE 20 MG PO TABS: 20.0000 mg | ORAL_TABLET | Freq: Two times a day (BID) | ORAL | 0 refills | Status: DC

## 2021-01-18 MED ORDER — SERTRALINE HCL 100 MG PO TABS: 100.0000 mg | ORAL_TABLET | Freq: Every day | ORAL | 3 refills | Status: DC

## 2021-01-18 NOTE — Progress Notes (Signed)
Patient ID: Stacey Alvarado, female   DOB: [birth year], 50 y.o.   MRN: [id number] .Marland KitchenVirtual Visit via Video Note  I connected with [name] on 01/18/21 at  1:40 PM EDT by a video enabled telemedicine application and verified that I am speaking with the correct person using two identifiers.  Location: Patient: home Provider: clinic  .Marland KitchenParticipating in visit:  Patient: Vikki Ports Provider: Tandy Gaw PA-C Provider in training: Janae Bridgeman PA-Student   I discussed the limitations of evaluation and management by telemedicine and the availability of in person appointments. The patient expressed understanding and agreed to proceed.  History of Present Illness: Pt is 50 yo female with ADD, MDD, Mood disorder, hypothyroidism who presents to the clinic for medications refills.   Overall mood is stable.  She feels like her Adderall is working well at work.  Her job is going well and she is getting things done.  She denies any increase in anxiety, insomnia, palpitations, headaches.  She does need refill today.  She has had 2 or 3 days of sinus pressure, nasal congestion, cough, ear popping.  She has not really tried anything to make better.  She denies any shortness of breath, body aches.  She has ran a low-grade fever.covid negative.    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   Vitamin D deficiency 05/07/2014   Class 1 obesity due to excess calories without serious comorbidity in adult 02/14/2016   No energy 08/13/2016   B12 deficiency 08/14/2016   Single skin nodule 12/10/2016   Left hand paresthesia  12/10/2016   Severe episode of recurrent major depressive disorder, without psychotic features (HCC) 05/10/2019   Mood disorder (HCC) 05/10/2019   Trouble in sleeping 10/07/2019   Diarrhea 11/20/2019   Resolved Ambulatory Problems    Diagnosis Date Noted   LOM 12/16/2007   BRONCHITIS, ACUTE 01/08/2008   URI 05/25/2010   Essential hypertension 01/17/2015   No Additional Past Medical History    Observations/Objective: No acute distress No labored breathing Flushed cheeks Normal appearance and mood   .Marland Kitchen Today's Vitals   01/18/21 1332  Temp: 100.2 F (37.9 C)  TempSrc: Oral  Weight: 190 lb (86.2 kg)  Height: 5\' 8"  (1.727 m)   Body mass index is 28.89 kg/m.  .. Depression screen [facility] 2/9 01/18/2021 01/12/2020 06/16/2019 05/07/2019 05/31/2017  Decreased Interest 3 3 3 3 3   Down, Depressed, Hopeless 2 3 3 2 2   PHQ - 2 Score 5 6 6 5 5   Altered sleeping 0 0 3 3 1   Tired, decreased energy 3 3 3 2 2   Change in appetite 2 3 0 0 2  Feeling bad or failure about yourself  0 3 3 3 2   Trouble concentrating 0 2 0 2 1  Moving slowly or fidgety/restless 0 0 0 0 1  Suicidal thoughts 0 0 0 0 0  PHQ-9 Score 10 17 15 15 14   Difficult doing work/chores Very difficult Very difficult Extremely dIfficult Very difficult Very difficult   .07/29/2017 GAD 7 : Generalized Anxiety Score 01/18/2021 01/12/2020 06/16/2019 05/07/2019  Nervous, Anxious, on Edge 2 2 3  2  Control/stop worrying 0 1 2 1   Worry too much - different things 0 0 1 1  Trouble relaxing 3 3 3 3   Restless 0 1 3 2   Easily annoyed or irritable 3 3 3 3   Afraid - awful might happen 0 2 0 0  Total GAD 7 Score 8 12 15 12   Anxiety Difficulty Very difficult Very difficult Somewhat difficult Very difficult      Assessment and Plan:  Tobey was seen today for follow-up.  Diagnoses and all orders for this visit:  Attention deficit disorder (ADD) without hyperactivity -     amphetamine-dextroamphetamine (ADDERALL) 20 MG tablet; Take 1 tablet  (20 mg total) by mouth 2 (two) times daily. -     amphetamine-dextroamphetamine (ADDERALL) 20 MG tablet; Take 1 tablet (20 mg total) by mouth 2 (two) times daily. -     amphetamine-dextroamphetamine (ADDERALL) 20 MG tablet; Take 1 tablet (20 mg total) by mouth 2 (two) times daily.  Moderate episode of recurrent major depressive disorder (HCC) -     sertraline (ZOLOFT) 100 MG tablet; Take 1 tablet (100 mg total) by mouth daily.  Mood disorder (HCC) -     sertraline (ZOLOFT) 100 MG tablet; Take 1 tablet (100 mg total) by mouth daily.  Encounter for screening mammogram for malignant neoplasm of breast -     MM 3D SCREEN BREAST BILATERAL  Viral upper respiratory tract infection  Refilled medications.   Needs mammogram.   Discussed viral infection.  Encourage patient to try Tylenol Cold sinus severe, Flonase, cough drops.  Continue to rest and hydrate.  If not improving after 7 to 10 days of infection and worsening will consider antibiotic.  Call abx if per not getting better with symptomatic treatment.    Follow Up Instructions:    I discussed the assessment and treatment plan with the patient. The patient was provided an opportunity to ask questions and all were answered. The patient agreed with the plan and demonstrated an understanding of the instructions.   The patient was advised to call back or seek an in-person evaluation if the symptoms worsen or if the condition fails to improve as anticipated.   , PA-C

## 2021-01-18 NOTE — Progress Notes (Signed)
PHQ9 (10) -GAD7 (8) completed.  Needs refills

## 2021-01-19 ENCOUNTER — Encounter: Payer: Self-pay | Admitting: Physician Assistant

## 2021-01-25 ENCOUNTER — Ambulatory Visit (INDEPENDENT_AMBULATORY_CARE_PROVIDER_SITE_OTHER): Payer: 59

## 2021-01-25 ENCOUNTER — Other Ambulatory Visit: Payer: Self-pay

## 2021-01-25 DIAGNOSIS — Z1231 Encounter for screening mammogram for malignant neoplasm of breast: Secondary | ICD-10-CM | POA: Diagnosis not present

## 2021-01-27 NOTE — Telephone Encounter (Signed)
Medication:  amphetamine-dextroamphetamine (ADDERALL) 20 MG tablet Prior authorization determination received Medication has been approved Approval dates: 01/04/2021-01/04/2024  Patient aware via: MyChart Pharmacy aware: Yes Provider aware via this encounter

## 2021-01-29 NOTE — Progress Notes (Signed)
Normal mammogram. Follow up in 1 year.

## 2021-03-29 ENCOUNTER — Encounter: Payer: Self-pay | Admitting: Physician Assistant

## 2021-03-31 ENCOUNTER — Encounter: Payer: Self-pay | Admitting: Physician Assistant

## 2021-03-31 MED ORDER — METHYLPHENIDATE HCL ER (OSM) 27 MG PO TBCR
27.0000 mg | EXTENDED_RELEASE_TABLET | ORAL | 0 refills | Status: DC
Start: 2021-03-31 — End: 2021-05-15

## 2021-04-14 ENCOUNTER — Telehealth: Payer: Self-pay

## 2021-04-14 NOTE — Telephone Encounter (Signed)
Medication: methylphenidate (CONCERTA) 27 MG PO CR tablet Prior authorization submitted via CoverMyMeds on 04/14/2021 PA submission pending

## 2021-04-14 NOTE — Telephone Encounter (Signed)
Medication: methylphenidate (CONCERTA) 27 MG PO CR tablet Prior authorization determination received Medication has been approved Approval dates: 04/14/2021-04/13/24  Patient aware via: MyChart Pharmacy aware: Yes Provider aware via this encounter

## 2021-05-10 ENCOUNTER — Other Ambulatory Visit: Payer: Self-pay | Admitting: Physician Assistant

## 2021-05-10 DIAGNOSIS — F331 Major depressive disorder, recurrent, moderate: Secondary | ICD-10-CM

## 2021-05-10 DIAGNOSIS — F39 Unspecified mood [affective] disorder: Secondary | ICD-10-CM

## 2021-05-15 ENCOUNTER — Encounter: Payer: Self-pay | Admitting: Physician Assistant

## 2021-05-15 ENCOUNTER — Telehealth (INDEPENDENT_AMBULATORY_CARE_PROVIDER_SITE_OTHER): Payer: 59 | Admitting: Physician Assistant

## 2021-05-15 VITALS — Ht 68.0 in | Wt 190.0 lb

## 2021-05-15 DIAGNOSIS — F331 Major depressive disorder, recurrent, moderate: Secondary | ICD-10-CM | POA: Diagnosis not present

## 2021-05-15 DIAGNOSIS — F39 Unspecified mood [affective] disorder: Secondary | ICD-10-CM

## 2021-05-15 DIAGNOSIS — R002 Palpitations: Secondary | ICD-10-CM | POA: Diagnosis not present

## 2021-05-15 DIAGNOSIS — F988 Other specified behavioral and emotional disorders with onset usually occurring in childhood and adolescence: Secondary | ICD-10-CM | POA: Diagnosis not present

## 2021-05-15 MED ORDER — AMPHETAMINE-DEXTROAMPHETAMINE 20 MG PO TABS: 20.0000 mg | ORAL_TABLET | Freq: Two times a day (BID) | ORAL | 0 refills | Status: DC

## 2021-05-15 MED ORDER — SERTRALINE HCL 100 MG PO TABS: 100.0000 mg | ORAL_TABLET | Freq: Every day | ORAL | 3 refills | Status: DC

## 2021-05-15 NOTE — Progress Notes (Signed)
..Virtual Visit via Video Note  I connected with Stacey Alvarado on 05/15/21 at  8:10 AM EST by a video enabled telemedicine application and verified that I am speaking with the correct person using two identifiers.  Location: Patient: car Provider: clinic  .Marland KitchenParticipating in visit:  Patient: Stacey Alvarado Provider: Tandy Gaw PA-C Provider in training: Shawna Orleans PA-S   I discussed the limitations of evaluation and management by telemedicine and the availability of in person appointments. The patient expressed understanding and agreed to proceed.  History of Present Illness: Pt is a 51 yo female with ADD, MDD, anxiety who needs medication refills.   She was not able to get adderall last month and had to take concerta. She noticed palpitations daily. She denied any problems sleeping or CP. She did also start Ashwaganda. She is not sure what caused the increase in palpitations. She continues to be very "down" and depressed. She has tried many medications in the past to include trintellix, wellbutrin, amitriptyline, abilify, buspar, cymbalta, pristiq, lexapro, lamictal, latuda, symbyax, paxil, effexor, vibryd which have not worked or she had side effects to. She has remained on zoloft which has worked the best. She now has insurance and would like referral.   .. 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   Vitamin D deficiency 05/07/2014   Class 1 obesity due to excess calories without serious comorbidity in adult 02/14/2016   No energy 08/13/2016   B12 deficiency 08/14/2016   Single skin nodule  12/10/2016   Left hand paresthesia 12/10/2016   Severe episode of recurrent major depressive disorder, without psychotic features (HCC) 05/10/2019   Mood disorder (HCC) 05/10/2019   Trouble in sleeping 10/07/2019   Diarrhea 11/20/2019   Palpitations 05/15/2021   Resolved Ambulatory Problems    Diagnosis Date Noted   LOM 12/16/2007   BRONCHITIS, ACUTE 01/08/2008   URI 05/25/2010   Essential hypertension 01/17/2015   No Additional Past Medical History    Observations/Objective: No acute distress Flat affect Normal breathing.   .. Today's Vitals   05/15/21 0805  Weight: 190 lb (86.2 kg)  Height: 5\' 8"  (1.727 m)   Body mass index is 28.89 kg/m.    Assessment and Plan: Marland KitchenAngelicia was seen today for medication refill.  Diagnoses and all orders for this visit:  Attention deficit disorder (ADD) without hyperactivity -     amphetamine-dextroamphetamine (ADDERALL) 20 MG tablet; Take 1 tablet (20 mg total) by mouth 2 (two) times daily. -     amphetamine-dextroamphetamine (ADDERALL) 20 MG tablet; Take 1 tablet (20 mg total) by mouth 2 (two) times daily. -     Ambulatory referral to Psychiatry  Moderate episode of recurrent major depressive disorder (HCC) -     sertraline (ZOLOFT) 100 MG tablet; Take 1 tablet (100 mg total) by mouth daily. -     Ambulatory referral to Psychiatry  Mood disorder (HCC) -     sertraline (ZOLOFT) 100 MG tablet; Take 1 tablet (100 mg total) by mouth daily. -     Ambulatory referral to Psychiatry  Palpitations  Other orders -     amphetamine-dextroamphetamine (ADDERALL) 20 MG  tablet; Take 1 tablet (20 mg total) by mouth 2 (two) times daily.  Refilled adderall for 3 months.  Stop concerta and see if palpitations do not resolve.  If not stop ashwaganda if they still do not then follow up.  Discussed causes of palpitations.   Referral placed for [facility].  Pt declines counseling referral. Continue on zoloft for now.  She has tried many medications  in the past to include trintellix, wellbutrin, amitriptyline, abilify, buspar, cymbalta, pristiq, lexapro, lamictal, latuda, symbyax, paxil, effexor, vibryd which have not worked or she had side effects to.    Follow Up Instructions:    I discussed the assessment and treatment plan with the patient. The patient was provided an opportunity to ask questions and all were answered. The patient agreed with the plan and demonstrated an understanding of the instructions.   The patient was advised to call back or seek an in-person evaluation if the symptoms worsen or if the condition fails to improve as anticipated.   Stacey Gaw, PA-C

## 2021-05-19 ENCOUNTER — Ambulatory Visit: Payer: 59 | Admitting: Physician Assistant

## 2021-05-19 ENCOUNTER — Other Ambulatory Visit: Payer: Self-pay

## 2021-05-19 ENCOUNTER — Encounter: Payer: Self-pay | Admitting: Physician Assistant

## 2021-05-19 VITALS — BP 159/79 | HR 67 | Ht 68.0 in | Wt 196.0 lb

## 2021-05-19 DIAGNOSIS — Z720 Tobacco use: Secondary | ICD-10-CM | POA: Diagnosis not present

## 2021-05-19 DIAGNOSIS — R002 Palpitations: Secondary | ICD-10-CM

## 2021-05-19 DIAGNOSIS — I10 Essential (primary) hypertension: Secondary | ICD-10-CM

## 2021-05-19 DIAGNOSIS — R079 Chest pain, unspecified: Secondary | ICD-10-CM | POA: Diagnosis not present

## 2021-05-19 LAB — COMPLETE METABOLIC PANEL WITH GFR
AG Ratio: 2.1 (calc) (ref 1.0–2.5)
ALT: 17 U/L (ref 6–29)
AST: 16 U/L (ref 10–35)
Albumin: 4.5 g/dL (ref 3.6–5.1)
Alkaline phosphatase (APISO): 80 U/L (ref 31–125)
BUN/Creatinine Ratio: 19 (calc) (ref 6–22)
BUN: 21 mg/dL (ref 7–25)
CO2: 29 mmol/L (ref 20–32)
Calcium: 9.6 mg/dL (ref 8.6–10.2)
Chloride: 107 mmol/L (ref 98–110)
Creat: 1.12 mg/dL — ABNORMAL HIGH (ref 0.50–0.99)
Globulin: 2.1 g/dL (calc) (ref 1.9–3.7)
Glucose, Bld: 135 mg/dL — ABNORMAL HIGH (ref 65–99)
Potassium: 4.1 mmol/L (ref 3.5–5.3)
Sodium: 141 mmol/L (ref 135–146)
Total Bilirubin: 0.3 mg/dL (ref 0.2–1.2)
Total Protein: 6.6 g/dL (ref 6.1–8.1)
eGFR: 60 mL/min/{1.73_m2} (ref >=60)

## 2021-05-19 LAB — CBC WITH DIFFERENTIAL/PLATELET
Absolute Monocytes: 562 cells/uL (ref 200–950)
Basophils Absolute: 39 cells/uL (ref 0–200)
Basophils Relative: 0.5 %
Eosinophils Absolute: 70 cells/uL (ref 15–500)
Eosinophils Relative: 0.9 %
HCT: 40.2 % (ref 35.0–45.0)
Hemoglobin: 13.5 g/dL (ref 11.7–15.5)
Lymphs Abs: 2449 cells/uL (ref 850–3900)
MCH: 30.7 pg (ref 27.0–33.0)
MCHC: 33.6 g/dL (ref 32.0–36.0)
MCV: 91.4 fL (ref 80.0–100.0)
MPV: 11.4 fL (ref 7.5–12.5)
Monocytes Relative: 7.2 %
Neutro Abs: 4680 cells/uL (ref 1500–7800)
Neutrophils Relative %: 60 %
Platelets: 288 10*3/uL (ref 140–400)
RBC: 4.4 10*6/uL (ref 3.80–5.10)
RDW: 12.8 % (ref 11.0–15.0)
Total Lymphocyte: 31.4 %
WBC: 7.8 10*3/uL (ref 3.8–10.8)

## 2021-05-19 LAB — CK TOTAL AND CKMB (NOT AT ARMC)
CK, MB: <0.7 ng/mL (ref 0–5.0)
Total CK: 83 U/L (ref 29–143)

## 2021-05-19 LAB — TROPONIN I: Troponin I: 3 ng/L (ref <=47)

## 2021-05-19 LAB — TSH: TSH: 1.53 mIU/L

## 2021-05-19 MED ORDER — METOPROLOL SUCCINATE ER 25 MG PO TB24: 25.0000 mg | ORAL_TABLET | Freq: Every day | ORAL | 0 refills | Status: DC

## 2021-05-19 NOTE — Progress Notes (Signed)
Subjective:    Patient ID: Stacey Alvarado, female    DOB: [birth year], 51 y.o.   MRN: [id number]  HPI Pt is a 51 yo obese female with ADD, hypothyroidism, MDD who presents to the clinic with palpitations and left sided chest pain.   Symptoms started about 1 month ago after she could not get adderall and used concerta for a month. She continues to not have stimulant because of shortage but not taking any stimulants and still having palpitations and intermittent left sided chest pain. Not associated with exertion. Not related to eating or food. No reflux symptoms. No cough. She is having more dull headaches. Not checking BP at home. She does feel the "fluttering of her heart every hour" as well as chest pains sharp and throughout the day. No SOB or edema.   Denies any other medication changes other than off adderall. No increase in caffeine. No alcohol use. She does vape with nicotine but no changes.   .. 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   Vitamin D deficiency 05/07/2014   Class 1 obesity due to excess calories without serious comorbidity in adult 02/14/2016   No energy 08/13/2016   B12 deficiency 08/14/2016   Single skin nodule 12/10/2016   Left hand paresthesia 12/10/2016   Severe episode of recurrent major depressive disorder, without psychotic features (HCC) 05/10/2019   Mood disorder (HCC) 05/10/2019   Trouble in sleeping 10/07/2019   Diarrhea 11/20/2019   Palpitations 05/15/2021   Vapes nicotine containing substance 05/19/2021   Left-sided chest pain 05/22/2021   Resolved Ambulatory  Problems    Diagnosis Date Noted   LOM 12/16/2007   BRONCHITIS, ACUTE 01/08/2008   URI 05/25/2010   Essential hypertension 01/17/2015   No Additional Past Medical History      Review of Systems See HPI.     Objective:   Physical Exam Vitals reviewed.  Constitutional:      Appearance: She is well-developed. She is obese.  HENT:     Head: Normocephalic.  Neck:     Thyroid: No thyromegaly.  Cardiovascular:     Rate and Rhythm: Normal rate and regular rhythm. No extrasystoles are present.    Pulses:          Carotid pulses are 0 on the right side and 0 on the left side.    Heart sounds: Normal heart sounds.  Pulmonary:     Effort: Pulmonary effort is normal.     Breath sounds: Normal breath sounds.  Chest:     Chest wall: No mass.  Abdominal:     General: Bowel sounds are normal. There is no abdominal bruit.     Palpations: Abdomen is soft. There is no hepatomegaly or mass.     Tenderness: There is no abdominal tenderness. There is no guarding or rebound.  Musculoskeletal:     Cervical back: Normal range of motion and neck supple.     Right lower leg: No tenderness. No edema.     Left lower leg: No tenderness. No edema.  Lymphadenopathy:     Cervical: No cervical adenopathy.  Neurological:  General: No focal deficit present.     Mental Status: She is alert and oriented to person, place, and time.  Psychiatric:        Mood and Affect: Mood normal.   EKG NSR with no ST elevation or depression.        Assessment & Plan:  Marland KitchenMarland KitchenKathlynn was seen today for chest pain.  Diagnoses and all orders for this visit:  Palpitations -     EKG 12-Lead -     Troponin I - -     metoprolol succinate (TOPROL-XL) 25 MG 24 hr tablet; Take 1 tablet (25 mg total) by mouth daily. -     TSH -     CBC w/Diff/Platelet -     COMPLETE METABOLIC PANEL WITH GFR -     CK total and CKMB (cardiac)not at [facility]  Vapes nicotine containing substance -     EKG 12-Lead -     Troponin I - -      TSH -     CBC w/Diff/Platelet -     COMPLETE METABOLIC PANEL WITH GFR -     CK total and CKMB (cardiac)not at [facility]  Left-sided chest pain -     EKG 12-Lead -     Troponin I - -     metoprolol succinate (TOPROL-XL) 25 MG 24 hr tablet; Take 1 tablet (25 mg total) by mouth daily. -     TSH -     CBC w/Diff/Platelet -     COMPLETE METABOLIC PANEL WITH GFR -     CK total and CKMB (cardiac)not at [facility]   Reassured that EKG looked good.  HO given on palpitations and causes of PVC Stat labs to look at cardiac enzymes and reasons for palpitations ordered today No lipid was order due to not being fasting Will start metoprolol since symptomatic and BP elevated today.  Will order stress test and holter monitor.  Follow up in 4 weeks or sooner if symptoms change or worsen.

## 2021-05-19 NOTE — Patient Instructions (Addendum)
Premature Ventricular Contraction A premature ventricular contraction (PVC) is a common kind of irregular heartbeat (arrhythmia). These contractions are extra heartbeats that start in the ventricles of the heart and occur too early in the normal sequence. During the PVC, the heart's normal electrical pathway is not used, so the beat is shorter and less effective. In most cases, these contractions come and go and do not require treatment. What are the causes? Common causes of the condition include: Smoking. Drinking alcohol. Certain medicines. Some illegal drugs. Stress. Caffeine. Certain medical conditions can also cause PVCs: Heart failure. Heart attack, or coronary artery disease. Heart valve problems. Changes in minerals in the blood (electrolytes). Low blood oxygen levels or high carbon dioxide levels. In many cases, the cause of this condition is not known. What are the signs or symptoms? The main symptom of this condition is fast or skipped heartbeats (palpitations). Other symptoms include: Chest pain. Shortness of breath. Feeling tired. Dizziness. Difficulty exercising. In some cases, there are no symptoms. How is this diagnosed? This condition may be diagnosed based on: Your medical history. A physical exam. During the exam, the health care provider will check for irregular heartbeats. Tests, such as: An ECG (electrocardiogram) to monitor the electrical activity of your heart. An ambulatory cardiac monitor. This device records your heartbeats for 24 hours or more. Stress tests to see how exercise affects your heart rhythm and blood supply. An echocardiogram. This test uses sound waves (ultrasound) to produce an image of your heart. An electrophysiology study (EPS). This test checks for electrical problems in your heart. How is this treated? Treatment for this condition depends on any underlying conditions, the type of PVCs that you are having, and how much the symptoms  are interfering with your daily life. Possible treatments include: Avoiding things that cause premature contractions (triggers). These include caffeine and alcohol. Taking medicines if symptoms are severe or if the extra heartbeats are frequent. Getting treatment for underlying conditions that cause PVCs. Having an implantable cardioverter defibrillator (ICD), if you are at risk for a serious arrhythmia. The ICD is a small device that is inserted into your chest to monitor your heartbeat. When it senses an irregular heartbeat, it sends a shock to bring the heartbeat back to normal. Having a procedure to destroy the portion of the heart tissue that sends out abnormal signals (catheter ablation). In some cases, no treatment is required. Follow these instructions at home: Lifestyle Do not use any products that contain nicotine or tobacco, such as cigarettes, e-cigarettes, and chewing tobacco. If you need help quitting, ask your health care provider. Do not use illegal drugs. Exercise regularly. Ask your health care provider what type of exercise is safe for you. Try to get at least 7-9 hours of sleep each night, or as much as recommended by your health care provider. Find healthy ways to manage stress. Avoid stressful situations when possible. Alcohol use Do not drink alcohol if: Your health care provider tells you not to drink. You are pregnant, may be pregnant, or are planning to become pregnant. Alcohol triggers your episodes. If you drink alcohol: Limit how much you use to: 0-1 drink a day for women. 0-2 drinks a day for men. Be aware of how much alcohol is in your drink. In the [place] one drink equals one 12 oz bottle of beer (355 mL), one 5 oz glass of wine (148 mL), or one 1 oz glass of hard liquor (44 mL). General instructions Take over-the-counter and prescription  medicines only as told by your health care provider. If caffeine triggers episodes of PVC, do not eat, drink, or use  anything with caffeine in it. Keep all follow-up visits as told by your health care provider. This is important. Contact a health care provider if you: Feel palpitations. Get help right away if you: Have chest pain. Have shortness of breath. Have sweating for no reason. Have nausea and vomiting. Become light-headed or you faint. Summary A premature ventricular contraction (PVC) is a common kind of irregular heartbeat (arrhythmia). In most cases, these contractions come and go and do not require treatment. You may need to wear an ambulatory cardiac monitor. This records your heartbeats for 24 hours or more. Treatment depends on any underlying conditions, the type of PVCs that you are having, and how much the symptoms are interfering with your daily life. This information is not intended to replace advice given to you by your health care provider. Make sure you discuss any questions you have with your health care provider. Document Revised: 09/06/2020 Document Reviewed: 09/06/2020 Elsevier Patient Education  2022 [employer] Inc. Palpitations Palpitations are feelings that your heartbeat is not normal. Your heartbeat may feel like it is: Uneven (irregular). Faster than normal. Fluttering. Skipping a beat. This is usually not a serious problem. However, a doctor will do tests and check your medical history to make sure that you do not have a serious heart problem. Follow these instructions at home: Watch for any changes in your condition. Tell your doctor about any changes. Take these actions to help manage your symptoms: Eating and drinking Follow instructions from your doctor about things to eat and drink. You may be told to avoid these things: Drinks that have caffeine in them, such as coffee, tea, soft drinks, and energy drinks. Chocolate. Alcohol. Diet pills. Lifestyle   Try to lower your stress. These things can help you relax: Yoga. Deep breathing and meditation. Guided  imagery. This is using words and images to create positive thoughts. Exercise, including swimming, jogging, and walking. Tell your doctor if you have more abnormal heartbeats when you are active. If you have chest pain or feel short of breath with exercise, do not keep doing the exercise until you are seen by your doctor. Biofeedback. This is using your mind to control things in your body, such as your heartbeat. Get plenty of rest and sleep. Keep a regular bed time. Do not use drugs, such as cocaine or ecstasy. Do not use marijuana. Do not smoke or use any products that contain nicotine or tobacco. If you need help quitting, ask your doctor. General instructions Take over-the-counter and prescription medicines only as told by your doctor. Keep all follow-up visits. You may need more tests if palpitations do not go away or get worse. Contact a doctor if: You keep having fast or uneven heartbeats for a long time. Your symptoms happen more often. Get help right away if: You have chest pain. You feel short of breath. You have a very bad headache. You feel dizzy. You faint. These symptoms may be an emergency. Get help right away. Call your local emergency services (911 in the [place] Do not wait to see if the symptoms will go away. Do not drive yourself to the hospital. Summary Palpitations are feelings that your heartbeat is uneven or faster than normal. It may feel like your heart is fluttering or skipping a beat. Avoid food and drinks that may cause this condition. These include caffeine, chocolate, and  alcohol. Try to lower your stress. Do not smoke or use drugs. Get help right away if you faint, feel dizzy, feel short of breath, have chest pain, or have a very bad headache. This information is not intended to replace advice given to you by your health care provider. Make sure you discuss any questions you have with your health care provider. Document Revised: 08/31/2020 Document Reviewed:  08/31/2020 Elsevier Patient Education  2022 [employer]

## 2021-05-22 ENCOUNTER — Encounter: Payer: Self-pay | Admitting: Physician Assistant

## 2021-05-22 ENCOUNTER — Ambulatory Visit (INDEPENDENT_AMBULATORY_CARE_PROVIDER_SITE_OTHER): Payer: 59

## 2021-05-22 DIAGNOSIS — R079 Chest pain, unspecified: Secondary | ICD-10-CM

## 2021-05-22 DIAGNOSIS — R002 Palpitations: Secondary | ICD-10-CM

## 2021-05-22 DIAGNOSIS — I10 Essential (primary) hypertension: Secondary | ICD-10-CM

## 2021-05-22 NOTE — Progress Notes (Signed)
Creatinine a little elevated. Make sure drinking plenty of water and only using NSAIDs as needed.  Liver enzymes look good.  Thyroid normal.  No elevated of cardiac enzymes.

## 2021-05-22 NOTE — Progress Notes (Unsigned)
Enrolled for [employer] to mail a ZIO XT long term holter monitor to the patients address on file.  

## 2021-05-23 ENCOUNTER — Encounter: Payer: Self-pay | Admitting: Physician Assistant

## 2021-05-24 DIAGNOSIS — I10 Essential (primary) hypertension: Secondary | ICD-10-CM

## 2021-05-24 DIAGNOSIS — R079 Chest pain, unspecified: Secondary | ICD-10-CM | POA: Diagnosis not present

## 2021-05-24 DIAGNOSIS — R002 Palpitations: Secondary | ICD-10-CM

## 2021-05-24 NOTE — Telephone Encounter (Signed)
Letter written and available in mychart

## 2021-05-30 ENCOUNTER — Telehealth (HOSPITAL_COMMUNITY): Payer: Self-pay | Admitting: *Deleted

## 2021-05-30 NOTE — Telephone Encounter (Signed)
Close encounter 

## 2021-05-31 ENCOUNTER — Other Ambulatory Visit: Payer: Self-pay

## 2021-05-31 ENCOUNTER — Ambulatory Visit (HOSPITAL_COMMUNITY)
Admission: RE | Admit: 2021-05-31 | Discharge: 2021-05-31 | Disposition: A | Discharge status: Home / Self Care | Payer: 59 | Source: Ambulatory Visit | Attending: Cardiovascular Disease | Admitting: Cardiovascular Disease

## 2021-05-31 DIAGNOSIS — R079 Chest pain, unspecified: Secondary | ICD-10-CM | POA: Diagnosis not present

## 2021-05-31 DIAGNOSIS — I10 Essential (primary) hypertension: Secondary | ICD-10-CM | POA: Insufficient documentation

## 2021-05-31 DIAGNOSIS — R002 Palpitations: Secondary | ICD-10-CM | POA: Diagnosis not present

## 2021-05-31 LAB — EXERCISE TOLERANCE TEST
Angina Index: 0
Duke Treadmill Score: 7
Estimated workload: 8.5
Exercise duration (min): 7 min
Exercise duration (sec): 0 s
MPHR: 171 {beats}/min
Peak HR: 164 {beats}/min
Percent HR: 95 %
Rest HR: 76 {beats}/min
ST Depression (mm): 0 mm

## 2021-05-31 NOTE — Progress Notes (Signed)
Stress test is normal. Great news. No signs of heart ischemia.

## 2021-06-07 ENCOUNTER — Encounter: Payer: Self-pay | Admitting: Physician Assistant

## 2021-06-07 NOTE — Progress Notes (Signed)
Mostly sinus rhythm. Occasional PAC and a few PAC together. These are usually benign findings and not worrisome. How are you doing? How do you feel?

## 2021-06-08 ENCOUNTER — Encounter: Payer: Self-pay | Admitting: Physician Assistant

## 2021-06-09 MED ORDER — METOPROLOL SUCCINATE ER 50 MG PO TB24: 75.0000 mg | ORAL_TABLET | Freq: Every day | ORAL | 1 refills | Status: DC

## 2021-06-16 ENCOUNTER — Other Ambulatory Visit: Payer: Self-pay | Admitting: Physician Assistant

## 2021-06-16 DIAGNOSIS — R079 Chest pain, unspecified: Secondary | ICD-10-CM

## 2021-06-16 DIAGNOSIS — R002 Palpitations: Secondary | ICD-10-CM

## 2021-06-28 ENCOUNTER — Encounter: Payer: Self-pay | Admitting: Physician Assistant

## 2021-06-30 MED ORDER — METHYLPHENIDATE HCL ER (OSM) 36 MG PO TBCR: 36.0000 mg | EXTENDED_RELEASE_TABLET | Freq: Every day | ORAL | 0 refills | Status: DC

## 2021-08-06 ENCOUNTER — Encounter: Payer: Self-pay | Admitting: Physician Assistant

## 2021-08-07 MED ORDER — METHYLPHENIDATE HCL ER (OSM) 36 MG PO TBCR: 36.0000 mg | EXTENDED_RELEASE_TABLET | Freq: Every day | ORAL | 0 refills | Status: DC

## 2021-08-11 MED ORDER — METHYLPHENIDATE HCL ER (OSM) 36 MG PO TBCR: 36.0000 mg | EXTENDED_RELEASE_TABLET | Freq: Every day | ORAL | 0 refills | Status: DC

## 2021-09-10 ENCOUNTER — Encounter: Payer: Self-pay | Admitting: Physician Assistant

## 2021-09-11 MED ORDER — METHYLPHENIDATE HCL ER (OSM) 36 MG PO TBCR: 36.0000 mg | EXTENDED_RELEASE_TABLET | Freq: Every day | ORAL | 0 refills | Status: DC

## 2021-11-09 ENCOUNTER — Encounter: Payer: Self-pay | Admitting: Physician Assistant

## 2021-11-09 DIAGNOSIS — F988 Other specified behavioral and emotional disorders with onset usually occurring in childhood and adolescence: Secondary | ICD-10-CM

## 2021-11-10 MED ORDER — AMPHETAMINE-DEXTROAMPHETAMINE 20 MG PO TABS: 20.0000 mg | ORAL_TABLET | Freq: Two times a day (BID) | ORAL | 0 refills | Status: DC

## 2021-11-22 ENCOUNTER — Encounter: Payer: Self-pay | Admitting: Neurology

## 2021-12-21 ENCOUNTER — Other Ambulatory Visit: Payer: Self-pay | Admitting: Physician Assistant

## 2021-12-21 DIAGNOSIS — F988 Other specified behavioral and emotional disorders with onset usually occurring in childhood and adolescence: Secondary | ICD-10-CM

## 2021-12-22 ENCOUNTER — Other Ambulatory Visit: Payer: Self-pay | Admitting: Physician Assistant

## 2021-12-22 NOTE — Telephone Encounter (Signed)
V.Mail left for patient to return call and schedule f/u visit for further refills!

## 2021-12-22 NOTE — Telephone Encounter (Signed)
Patient needs appointment for further refills.  Last office visit 05/19/2021  Last filled 11/10/2021

## 2021-12-28 ENCOUNTER — Telehealth: Payer: Self-pay | Admitting: Physician Assistant

## 2021-12-28 DIAGNOSIS — F988 Other specified behavioral and emotional disorders with onset usually occurring in childhood and adolescence: Secondary | ICD-10-CM

## 2021-12-28 NOTE — Telephone Encounter (Signed)
Patient called she is out of her adderall 20mg  and needs refills she wants to make an appt but just started new job and can't get off work also she needs metoprolol 50mg .

## 2021-12-29 ENCOUNTER — Encounter: Payer: Self-pay | Admitting: Physician Assistant

## 2021-12-29 DIAGNOSIS — F988 Other specified behavioral and emotional disorders with onset usually occurring in childhood and adolescence: Secondary | ICD-10-CM

## 2021-12-29 MED ORDER — AMPHETAMINE-DEXTROAMPHETAMINE 20 MG PO TABS: 20.0000 mg | ORAL_TABLET | Freq: Two times a day (BID) | ORAL | 0 refills | Status: DC

## 2021-12-29 MED ORDER — METOPROLOL SUCCINATE ER 50 MG PO TB24: 75.0000 mg | ORAL_TABLET | Freq: Every day | ORAL | 0 refills | Status: DC

## 2021-12-29 NOTE — Telephone Encounter (Signed)
On backorder at [employer]. Pended for [employer]. Please sign.

## 2021-12-29 NOTE — Addendum Note (Signed)
Addended bySilvio Pate on: 12/29/2021 04:15 PM   Modules accepted: Orders

## 2022-01-18 ENCOUNTER — Other Ambulatory Visit: Payer: Self-pay | Admitting: Physician Assistant

## 2022-01-26 ENCOUNTER — Ambulatory Visit: Payer: 59 | Admitting: Physician Assistant

## 2022-02-03 ENCOUNTER — Other Ambulatory Visit: Payer: Self-pay | Admitting: Physician Assistant

## 2022-02-08 ENCOUNTER — Telehealth: Payer: Self-pay | Admitting: Physician Assistant

## 2022-02-08 NOTE — Telephone Encounter (Signed)
Patient has a scheduled appointment with you tomorrow but daughter just tested positive for COVID and she woke up not well. She needs her meds she tated but wants to see if you can do virtual tomorrow instead?

## 2022-02-09 ENCOUNTER — Telehealth (INDEPENDENT_AMBULATORY_CARE_PROVIDER_SITE_OTHER): Payer: 59 | Admitting: Physician Assistant

## 2022-02-09 VITALS — BP 141/80 | HR 71 | Ht 68.0 in | Wt 196.0 lb

## 2022-02-09 DIAGNOSIS — F331 Major depressive disorder, recurrent, moderate: Secondary | ICD-10-CM

## 2022-02-09 DIAGNOSIS — F988 Other specified behavioral and emotional disorders with onset usually occurring in childhood and adolescence: Secondary | ICD-10-CM

## 2022-02-09 DIAGNOSIS — Z13220 Encounter for screening for lipoid disorders: Secondary | ICD-10-CM

## 2022-02-09 DIAGNOSIS — F39 Unspecified mood [affective] disorder: Secondary | ICD-10-CM

## 2022-02-09 DIAGNOSIS — I10 Essential (primary) hypertension: Secondary | ICD-10-CM

## 2022-02-09 DIAGNOSIS — E038 Other specified hypothyroidism: Secondary | ICD-10-CM

## 2022-02-09 DIAGNOSIS — R002 Palpitations: Secondary | ICD-10-CM

## 2022-02-09 MED ORDER — LISINOPRIL 5 MG PO TABS: 5.0000 mg | ORAL_TABLET | Freq: Every day | ORAL | 0 refills | Status: DC

## 2022-02-09 MED ORDER — AMPHETAMINE-DEXTROAMPHETAMINE 20 MG PO TABS: 20.0000 mg | ORAL_TABLET | Freq: Two times a day (BID) | ORAL | 0 refills | Status: DC

## 2022-02-09 MED ORDER — METOPROLOL SUCCINATE ER 50 MG PO TB24: 75.0000 mg | ORAL_TABLET | Freq: Every day | ORAL | 1 refills | Status: DC

## 2022-02-09 NOTE — Progress Notes (Unsigned)
..Virtual Visit via Video Note  I connected with Stacey Alvarado on 02/14/22 at 10:50 AM EDT by a video enabled telemedicine application and verified that I am speaking with the correct person using two identifiers.  Location: Patient: home Provider: clinic  .Marland KitchenParticipating in visit:  Patient: Stacey Alvarado Provider: Tandy Gaw PA-C   I discussed the limitations of evaluation and management by telemedicine and the availability of in person appointments. The patient expressed understanding and agreed to proceed.  History of Present Illness: Pt is a 51 yo female with ADD, MDD, Anxiety, mood disorder who needs refills.   She could not come into office today due to home exposure to covid.   She just got insurance and would now like referral to [facility]. She has tried many medications in the past to control mood and not effective. Zoloft seems to work the best and she has stayed on that. No SI/HC. Adderall seems to be doing well for focus.   She does need metoprolol for palpitations. She is not having any problems with those.   .. 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   Vitamin D deficiency 05/07/2014   Primary hypertension 01/17/2015   Class 1 obesity due to excess calories without serious comorbidity in adult 02/14/2016   No energy 08/13/2016   B12 deficiency 08/14/2016   Single skin nodule 12/10/2016   Left hand paresthesia 12/10/2016   Severe episode of recurrent major depressive disorder, without psychotic features (HCC) 05/10/2019   Mood disorder (HCC) 05/10/2019   Trouble in sleeping 10/07/2019    Diarrhea 11/20/2019   Palpitations 05/15/2021   Vapes nicotine containing substance 05/19/2021   Left-sided chest pain 05/22/2021   Resolved Ambulatory Problems    Diagnosis Date Noted   LOM 12/16/2007   BRONCHITIS, ACUTE 01/08/2008   URI 05/25/2010   No Additional Past Medical History       Observations/Objective: No acute distress Normal mood and appearance Normal breathing  .Marland Kitchen Today's Vitals   02/09/22 1022 02/09/22 1057  BP: (!) 150/90 (!) 141/80  Pulse: 71   Weight: 196 lb (88.9 kg)   Height: 5\' 8"  (1.727 m)    Body mass index is 29.8 kg/m.  ..    05/19/2021    3:06 PM 05/15/2021    8:00 AM 01/18/2021    1:33 PM 01/12/2020   10:51 AM 06/16/2019   10:11 AM  Depression screen PHQ 2/9  Decreased Interest 3 3 3 3 3   Down, Depressed, Hopeless 3 2 2 3 3   PHQ - 2 Score 6 5 5 6 6   Altered sleeping 1 2 0 0 3  Tired, decreased energy 2 1 3 3 3   Change in appetite 2 1 2 3  0  Feeling bad or failure about yourself  2 2 0 3 3  Trouble concentrating 2 1 0 2 0  Moving slowly or fidgety/restless 0 0 0 0 0  Suicidal thoughts 0 0 0 0 0  PHQ-9 Score 15 12 10 17 15   Difficult doing work/chores Very difficult  Very difficult Very difficult Extremely dIfficult   ..    05/19/2021  3:06 PM 05/15/2021    8:02 AM 01/18/2021    1:35 PM 01/12/2020   10:53 AM  GAD 7 : Generalized Anxiety Score  Nervous, Anxious, on Edge 2 2 2 2   Control/stop worrying 1 1 0 1  Worry too much - different things 1 2 0 0  Trouble relaxing 1 2 3 3   Restless 1 1 0 1  Easily annoyed or irritable 2 2 3 3   Afraid - awful might happen 1 1 0 2  Total GAD 7 Score 9 11 8 12   Anxiety Difficulty Very difficult  Very difficult Very difficult       Assessment and Plan: Marland KitchenMarland KitchenHayven was seen today for hypertension.  Diagnoses and all orders for this visit:  Primary hypertension -     COMPLETE METABOLIC PANEL WITH GFR -     lisinopril (ZESTRIL) 5 MG tablet; Take 1 tablet (5 mg total) by mouth  daily.  Other specified hypothyroidism -     TSH  Lipid screening -     Lipid Panel w/reflex Direct LDL  Attention deficit disorder (ADD) without hyperactivity -     amphetamine-dextroamphetamine (ADDERALL) 20 MG tablet; Take 1 tablet (20 mg total) by mouth 2 (two) times daily. -     amphetamine-dextroamphetamine (ADDERALL) 20 MG tablet; Take 1 tablet (20 mg total) by mouth 2 (two) times daily. -     amphetamine-dextroamphetamine (ADDERALL) 20 MG tablet; Take 1 tablet (20 mg total) by mouth 2 (two) times daily. -     Ambulatory referral to Psychiatry  Mood disorder [facility]) -     Ambulatory referral to Psychiatry  Moderate episode of recurrent major depressive disorder ([place]) -     Ambulatory referral to Psychiatry  Palpitations -     metoprolol succinate (TOPROL-XL) 50 MG 24 hr tablet; Take 1.5 tablets (75 mg total) by mouth daily.   Adderall refilled for 3 months New referral made for mood, pt has tried many medications and none that really work Metroprolol for palpitations refilled BP not quite to goal Needs fasting labs   Follow Up Instructions:    I discussed the assessment and treatment plan with the patient. The patient was provided an opportunity to ask questions and all were answered. The patient agreed with the plan and demonstrated an understanding of the instructions.   The patient was advised to call back or seek an in-person evaluation if the symptoms worsen or if the condition fails to improve as anticipated.    Iran Planas, PA-C

## 2022-02-09 NOTE — Progress Notes (Unsigned)
Follow up on HTN - 150/90 this morning, patient will recheck prior to appt  Needs refills on Metoprolol and Adderall. Pended

## 2022-02-14 ENCOUNTER — Encounter: Payer: Self-pay | Admitting: Physician Assistant

## 2022-03-09 ENCOUNTER — Telehealth (INDEPENDENT_AMBULATORY_CARE_PROVIDER_SITE_OTHER): Payer: Self-pay | Admitting: Physician Assistant

## 2022-03-09 ENCOUNTER — Encounter: Payer: Self-pay | Admitting: Physician Assistant

## 2022-03-09 VITALS — BP 123/82 | Temp 98.8°F | Ht 68.0 in | Wt 195.0 lb

## 2022-03-09 DIAGNOSIS — B9689 Other specified bacterial agents as the cause of diseases classified elsewhere: Secondary | ICD-10-CM

## 2022-03-09 DIAGNOSIS — J208 Acute bronchitis due to other specified organisms: Secondary | ICD-10-CM

## 2022-03-09 MED ORDER — PREDNISONE 50 MG PO TABS: ORAL_TABLET | ORAL | 0 refills | Status: DC

## 2022-03-09 MED ORDER — HYDROCOD POLI-CHLORPHE POLI ER 10-8 MG/5ML PO SUER: 5.0000 mL | Freq: Two times a day (BID) | ORAL | 0 refills | Status: DC | PRN

## 2022-03-09 MED ORDER — DOXYCYCLINE HYCLATE 100 MG PO TABS: 100.0000 mg | ORAL_TABLET | Freq: Two times a day (BID) | ORAL | 0 refills | Status: DC

## 2022-03-09 MED ORDER — ALBUTEROL SULFATE HFA 108 (90 BASE) MCG/ACT IN AERS: 2.0000 | INHALATION_SPRAY | Freq: Four times a day (QID) | RESPIRATORY_TRACT | 0 refills | Status: DC | PRN

## 2022-03-09 NOTE — Progress Notes (Signed)
..Virtual Visit via Video Note  I connected with Stacey Alvarado on 03/09/22 at 11:30 AM EST by a video enabled telemedicine application and verified that I am speaking with the correct person using two identifiers.  Location: Patient: home Provider: clinic  .Marland KitchenParticipating in visit:  Patient: Stacey Alvarado Provider:Dashanae Longfield PA-C   I discussed the limitations of evaluation and management by telemedicine and the availability of in person appointments. The patient expressed understanding and agreed to proceed.  History of Present Illness: Pt is a 51 yo female and current smoker who calls in with persistent cough, chest congestion, SOB. Symptoms started 9 days ago and worsening. Started with more flu like symptoms and now more chest symptoms.  She went to urgent care and gave her 10mg  of prednisone for 5 days. She has not had any improvement and worsening only. She continues on mucinex. No fever, chills, body aches. Her cough is so productive that she cannot talk on the phone for her job.   Flu and covid were negative at urgent care.   .. 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   Vitamin D deficiency 05/07/2014   Primary hypertension 01/17/2015   Class 1 obesity due to excess calories without serious comorbidity in adult 02/14/2016   No energy 08/13/2016   B12 deficiency 08/14/2016   Single skin nodule 12/10/2016   Left hand paresthesia 12/10/2016   Severe episode of recurrent major depressive disorder, without psychotic features (HCC) 05/10/2019   Mood disorder (HCC) 05/10/2019   Trouble in sleeping  10/07/2019   Diarrhea 11/20/2019   Palpitations 05/15/2021   Vapes nicotine containing substance 05/19/2021   Left-sided chest pain 05/22/2021   Resolved Ambulatory Problems    Diagnosis Date Noted   LOM 12/16/2007   BRONCHITIS, ACUTE 01/08/2008   URI 05/25/2010   No Additional Past Medical History      Observations/Objective: No acute distress No labored breathing Productive cough  .07/24/2010 Today's Vitals   03/09/22 1057  BP: 123/82  Temp: 98.8 F (37.1 C)  TempSrc: Oral  Weight: 195 lb (88.5 kg)  Height: 5\' 8"  (1.727 m)   Body mass index is 29.65 kg/m.    Assessment and Plan: 03/11/22 [name] was seen today for cough.  Diagnoses and all orders for this visit:  Acute bacterial bronchitis -     doxycycline (VIBRA-TABS) 100 MG tablet; Take 1 tablet (100 mg total) by mouth 2 (two) times daily. For 10 days. -     albuterol (VENTOLIN HFA) 108 (90 Base) MCG/ACT inhaler; Inhale 2 puffs into the lungs every 6 (six) hours as needed. -     chlorpheniramine-HYDROcodone (TUSSIONEX) 10-8 MG/5ML; Take 5 mLs by mouth every 12 (twelve) hours as needed for cough (cough, will cause drowsiness.). -     predniSONE (DELTASONE) 50 MG tablet; One tab PO daily for 5 days.   Bacterial bronchitis treated with doxycycline, higher dose of prednisone for 5 days, albuterol inhaler and tussionex at bedtime Continue mucinex as needed Follow up as needed if not improving  Certainly smoking is making harder to treat Smoking cessation encouraged   Follow Up Instructions:  I discussed the assessment and treatment plan with the patient. The patient was provided an opportunity to ask questions and all were answered. The patient agreed with the plan and demonstrated an understanding of the instructions.   The patient was advised to call back or seek an in-person evaluation if the symptoms worsen or if the condition fails to improve as anticipated.   Tandy Gaw, PA-C

## 2022-03-09 NOTE — Progress Notes (Signed)
Started 9 days ago Cough Shortness of breath Congestion/runny nose  Started with flu like symptoms that have gone away   Taking Mucinex cold and flu  Went to urgent care and was given steroids, finished Flu/Covid negative

## 2022-04-06 ENCOUNTER — Ambulatory Visit (HOSPITAL_BASED_OUTPATIENT_CLINIC_OR_DEPARTMENT_OTHER): Payer: Self-pay | Admitting: Psychiatry

## 2022-04-06 ENCOUNTER — Encounter (HOSPITAL_COMMUNITY): Payer: Self-pay | Admitting: Psychiatry

## 2022-04-06 VITALS — Wt 195.0 lb

## 2022-04-06 DIAGNOSIS — F419 Anxiety disorder, unspecified: Secondary | ICD-10-CM

## 2022-04-06 DIAGNOSIS — F331 Major depressive disorder, recurrent, moderate: Secondary | ICD-10-CM

## 2022-04-06 NOTE — Progress Notes (Signed)
[facility] Initial Assessment Note  Patient Location:Home Provider Location:Home Office   I connected with Stacey Alvarado Stands by video and verified that I am talking with correct person using two identifiers.   I discussed the limitations, risks, security and privacy concerns of performing an evaluation and management service virtually and the availability of in person appointments. I also discussed with the patient that there may be a patient responsible charge related to this service. The patient expressed understanding and agreed to proceed.  Stacey Alvarado [id number] 51 y.o.   Moderate episode of recurrent major depressive disorder (HCC)  Anxiety   04/06/2022 9:04 AM  Chief Complaint:  I feel blah all the time.  History of Present Illness:  Patient is 51 year old Caucasian, married, employed female who is referred from her PCP for the management of chronic depression and anxiety.  Patient reported had a severe depression more than 10 years ago which lasted more than 3 to 4 months.  She described at that time having financial issues and raising 3 teenage kids.  She feels since then she never free from depressive symptoms.  She saw a PA in [facility] on and off and tried multiple medication and some of them she do not recall very well why they were stopped.  She recall Abilify, Trintellix and Wellbutrin did not work and caused side effects.  She is taking Adderall 20 mg 2 times a day, Zoloft from her PCP for many years.  She never had any psychological testing for ADHD but given medicine to help her focus, attention.  She described symptoms of depression which are chronic.  She reported decreased energy, concentration, mood irritability, no motivation to do things and interests to go outside or socialize.  Sometimes she does not enjoy the company of people and feel very anxious and nervous.  There are some occasion when she go to grocery stores and she feels overwhelmed and  leave.  She did not recall any stressors that contributing to increased symptoms.  She sleeps okay.  She denies any hallucination, paranoia, active or passive suicidal thoughts or homicidal thoughts.  There are times when she feels hopelessness and feels tired.  She had gained 35 pounds in past 3 months but overall she had gained 20 pounds in 1 year.  She used to walk and exercise but due to back pain has stopped.  She denies any hallucination, paranoia, aggression, violence, nightmares, flashbacks or any history of abuse or substance abuse.  She lives with her husband and her daughter who is in college.  She is married for 28 years and her husband is supportive.  Patient works from home and started a new job since June and [employer].  Her job is to do prior authorization.  Patient not seeing any therapist but open to consider.  She has a history of heart palpitation, headaches, hypertension, joint pain, vitamin D deficiency.  She reported her mother and brother has anxiety and they take medication.  So far she do not recall any side effects from the medication.  She never consider TMS, ECT and never had gensight testing.   Past Psychiatric History: History of depression in her 22s which she described very severe that lasted almost 4 months.  She saw nurse practitioner in [facility] and recalled taking Trintellix, Pristiq, Paxil, Effexor, Viibryd, Wellbutrin, amitriptyline, Lexapro, Abilify, Symbyax, Lamictal, BuSpar, Cymbalta, Latuda and Concerta.  She recall Abilify, Trintellix and Wellbutrin caused side effects but other do not remember very  well.  No history of suicidal attempt, inpatient treatment, psychosis, PTSD, mania.  Denies any history of legal issues, head trauma, seizures.     Past Medical History:  Diagnosis Date   Back pain    Headache    HTN (hypertension)      Traumatic Head Injury: No h/o head trauma.  Work History; Patient finished college.  Her grades were good.  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  She decided to work from home and worked for [employer] and recently working for [employer] remotely.  Psychosocial History; Patient born and raised in [place].  Moved to [place] in 1989.  She is married to her husband for 28 years.  She has 3 children who are 17 year old, 2 year old and 26 year old.  Her parents living together and close by.  Patient has 2 brother.  Legal History; Patient denies any legal issues.  History Of Abuse; Patient denies any history of abuse.  Substance Abuse History; Patient denies any history of illegal substance use.  Neurologic: Headache: Yes Seizure: No Paresthesias: No   Outpatient Encounter Medications as of 04/06/2022  Medication Sig   albuterol (VENTOLIN HFA) 108 (90 Base) MCG/ACT inhaler Inhale 2 puffs into the lungs every 6 (six) hours as needed.   amphetamine-dextroamphetamine (ADDERALL) 20 MG tablet Take 1 tablet (20 mg total) by mouth 2 (two) times daily.   amphetamine-dextroamphetamine (ADDERALL) 20 MG tablet Take 1 tablet (20 mg total) by mouth 2 (two) times daily.   [START ON 04/09/2022] amphetamine-dextroamphetamine (ADDERALL) 20 MG tablet Take 1 tablet (20 mg total) by mouth 2 (two) times daily.   chlorpheniramine-HYDROcodone (TUSSIONEX) 10-8 MG/5ML Take 5 mLs by mouth every 12 (twelve) hours as needed for cough (cough, will cause drowsiness.).   doxycycline (VIBRA-TABS) 100 MG tablet Take 1 tablet (100 mg total) by mouth 2 (two) times daily. For 10 days.   ketorolac (TORADOL) 10 MG tablet Take 10 mg by mouth every 6 (six) hours as needed.   lisinopril (ZESTRIL) 5 MG tablet Take 1 tablet (5 mg total) by mouth daily.   metoprolol succinate (TOPROL-XL) 50 MG 24 hr tablet Take 1.5 tablets (75 mg total) by mouth daily.   oxyCODONE (ROXICODONE) 15 MG immediate release tablet Take 15 mg by mouth every 6 (six) hours as needed.   predniSONE  (DELTASONE) 50 MG tablet One tab PO daily for 5 days.   sertraline (ZOLOFT) 100 MG tablet Take 1 tablet (100 mg total) by mouth daily.   No facility-administered encounter medications on file as of 04/06/2022.    No results found for this or any previous visit (from the past 2160 hour(s)).    Constitutional:  There were no vitals taken for this visit.   Musculoskeletal: Strength & Muscle Tone: within normal limits Gait & Station: normal Patient leans: N/A  Psychiatric Specialty Exam: Physical Exam  Review of Systems  Musculoskeletal:  Positive for back pain.  Neurological:  Positive for headaches.    Weight 195 lb (88.5 kg).There is no height or weight on file to calculate BMI.  General Appearance: Casual  Eye Contact:  Good  Speech:  Clear and Coherent and Slow  Volume:  Normal  Mood:  Dysphoric  Affect:  Constricted  Thought Process:  Goal Directed  Orientation:  Full (Time, Place, and Person)  Thought Content:  Rumination  Suicidal Thoughts:  No  Homicidal Thoughts:  No  Memory:  Immediate;  Good Recent;   Good Remote;   Good  Judgement:  Intact  Insight:  Present  Psychomotor Activity:  Decreased  Concentration:  Concentration: Good and Attention Span: Good  Recall:  Good  Fund of Knowledge:  Good  Language:  Good  Akathisia:  No  Handed:  Right  AIMS (if indicated):     Assets:  Communication Skills Desire for Improvement Housing Social Support Talents/Skills Transportation  ADL's:  Intact  Cognition:  WNL  Sleep:   ok     Assessment/Plan:  Patient is 51 year old Caucasian, married, employed female with history of major depression and anxiety now referred from PCP for medication management.  Currently she is taking Zoloft 100 mg daily and Adderall 20 mg 2 times a day.  I reviewed her history, medication, psychosocial, past medication.  She recalled taking multiple medication including Trintellix, Pristiq, Paxil, Effexor, Viibryd, Wellbutrin,  amitriptyline, Lexapro, Abilify, Symbyax, Lamictal, BuSpar, Cymbalta, Latuda, Concerta but other than Abilify, Trintellix Wellbutrin which caused side effects do not remember very well the details.  She recalls Cymbalta help because at that time she is having a lot of back pain.  It is unclear why it was discontinued.  No history of TMS, ECT and never had genesight testing.  Given the history of hypertension and heart palpitations I recommend stimulant may not be a better choice for her and there is no formal psychological testing to establish diagnosis of ADHD.  She did not recall having issues in her school age and her grades were good.  I recommend she can try higher dose of Zoloft 150 mg however like to have genetic testing she is a better option for the medication.  Also recommend consider therapy for chronic depression and anxiety.  She promised to look into it and wants to get referral.  She agreed to come to our office for the samples for genetic testing.  No new medication added.  We will follow-up in 3 weeks when we have genetic testing results available.  TMS could be an option if genetic testing did not provide favorable results.  Discussed safety concern that anytime having active suicidal thoughts or homicidal thought then she need to call 911 or go to local emergency room.  Follow-up in 3 weeks.   Cleotis Nipper, MD 04/06/2022    Follow Up Instructions: I discussed the assessment and treatment plan with the patient. The patient was provided an opportunity to ask questions and all were answered. The patient agreed with the plan and demonstrated an understanding of the instructions.   The patient was advised to call back or seek an in-person evaluation if the symptoms worsen or if the condition fails to improve as anticipated.   Collaboration of Care: Primary Care Provider AEB notes are in epic to review.  We will forward the notes to her PCP   Patient/Guardian was advised Release of  Information must be obtained prior to any record release in order to collaborate their care with an outside provider. Patient/Guardian was advised if they have not already done so to contact the registration department to sign all necessary forms in order for [place] to release information regarding their care.    Consent: Patient/Guardian gives verbal consent for treatment and assignment of benefits for services provided during this visit. Patient/Guardian expressed understanding and agreed to proceed.     I provided 58 minutes of non-face-to-face time during this encounter.

## 2022-04-27 ENCOUNTER — Ambulatory Visit (HOSPITAL_COMMUNITY): Payer: Self-pay

## 2022-05-01 ENCOUNTER — Telehealth (HOSPITAL_COMMUNITY): Payer: Self-pay | Admitting: Psychiatry

## 2022-05-04 ENCOUNTER — Telehealth (HOSPITAL_COMMUNITY): Payer: Self-pay | Admitting: Psychiatry

## 2022-05-07 ENCOUNTER — Encounter: Payer: Self-pay | Admitting: Physician Assistant

## 2022-05-07 DIAGNOSIS — F331 Major depressive disorder, recurrent, moderate: Secondary | ICD-10-CM

## 2022-05-07 DIAGNOSIS — F39 Unspecified mood [affective] disorder: Secondary | ICD-10-CM

## 2022-05-07 DIAGNOSIS — F988 Other specified behavioral and emotional disorders with onset usually occurring in childhood and adolescence: Secondary | ICD-10-CM

## 2022-05-08 MED ORDER — AMPHETAMINE-DEXTROAMPHETAMINE 20 MG PO TABS: 20.0000 mg | ORAL_TABLET | Freq: Two times a day (BID) | ORAL | 0 refills | Status: DC

## 2022-05-08 NOTE — Telephone Encounter (Signed)
Refilled adderall and referral placed for [facility].

## 2022-05-10 ENCOUNTER — Other Ambulatory Visit: Payer: Self-pay | Admitting: Physician Assistant

## 2022-05-10 DIAGNOSIS — I10 Essential (primary) hypertension: Secondary | ICD-10-CM

## 2022-05-11 ENCOUNTER — Ambulatory Visit (HOSPITAL_COMMUNITY): Payer: Self-pay

## 2022-05-25 ENCOUNTER — Telehealth (HOSPITAL_COMMUNITY): Payer: Self-pay | Admitting: Psychiatry

## 2022-08-08 ENCOUNTER — Encounter: Payer: Self-pay | Admitting: Physician Assistant

## 2022-08-10 ENCOUNTER — Other Ambulatory Visit: Payer: Self-pay | Admitting: Physician Assistant

## 2022-08-10 DIAGNOSIS — I10 Essential (primary) hypertension: Secondary | ICD-10-CM

## 2022-09-10 ENCOUNTER — Other Ambulatory Visit: Payer: Self-pay

## 2022-09-10 DIAGNOSIS — R002 Palpitations: Secondary | ICD-10-CM

## 2022-09-10 MED ORDER — METOPROLOL SUCCINATE ER 50 MG PO TB24: 75.0000 mg | ORAL_TABLET | Freq: Every day | ORAL | 1 refills | Status: DC

## 2022-09-11 ENCOUNTER — Other Ambulatory Visit: Payer: Self-pay | Admitting: Physician Assistant

## 2022-09-11 DIAGNOSIS — I10 Essential (primary) hypertension: Secondary | ICD-10-CM

## 2022-09-28 ENCOUNTER — Other Ambulatory Visit: Payer: Self-pay

## 2022-09-28 DIAGNOSIS — I10 Essential (primary) hypertension: Secondary | ICD-10-CM

## 2022-09-28 MED ORDER — LISINOPRIL 5 MG PO TABS: 5.0000 mg | ORAL_TABLET | Freq: Every day | ORAL | 1 refills | Status: DC

## 2023-04-11 ENCOUNTER — Encounter: Payer: Self-pay | Admitting: Physician Assistant

## 2023-04-11 DIAGNOSIS — R002 Palpitations: Secondary | ICD-10-CM

## 2023-04-11 DIAGNOSIS — I10 Essential (primary) hypertension: Secondary | ICD-10-CM

## 2023-04-11 NOTE — Telephone Encounter (Signed)
Forwarding message to Dr. Linford Arnold covering Stacey Alvarado Please see pt attached message Requesting rx rf of Lisinopril 5mg  Last written 09/28/2022 Last OV 02/09/2022 No upcoming appt schld.- please see pt message

## 2023-04-12 ENCOUNTER — Other Ambulatory Visit: Payer: Self-pay

## 2023-04-12 DIAGNOSIS — I10 Essential (primary) hypertension: Secondary | ICD-10-CM

## 2023-04-12 MED ORDER — LISINOPRIL 5 MG PO TABS: 5.0000 mg | ORAL_TABLET | Freq: Every day | ORAL | 0 refills | Status: DC

## 2023-04-12 NOTE — Telephone Encounter (Signed)
Patient informed and schld for 04/22/23 with Stacey Alvarado for HTN f/u - 2 week supply sent to pharmacy .

## 2023-04-12 NOTE — Telephone Encounter (Signed)
 Attempted call to patient. Left a voice mail message  to return our call.

## 2023-04-12 NOTE — Telephone Encounter (Signed)
Please call pt hasn't been seen in over a yea.  OK to send 2 weeks worth. Needs an appt.

## 2023-04-22 ENCOUNTER — Ambulatory Visit: Payer: Self-pay | Admitting: Physician Assistant

## 2023-04-30 ENCOUNTER — Ambulatory Visit: Payer: Self-pay | Admitting: Physician Assistant

## 2023-04-30 NOTE — Addendum Note (Signed)
 Addended by: Jomarie Longs on: 04/30/2023 12:59 PM   Modules accepted: Orders

## 2023-05-01 MED ORDER — METOPROLOL SUCCINATE ER 50 MG PO TB24: 75.0000 mg | ORAL_TABLET | Freq: Every day | ORAL | 0 refills | Status: DC

## 2023-05-01 MED ORDER — LISINOPRIL 5 MG PO TABS: 5.0000 mg | ORAL_TABLET | Freq: Every day | ORAL | 0 refills | Status: DC

## 2023-05-01 NOTE — Telephone Encounter (Signed)
 Left message for patient to call back to schedule an appointment.

## 2023-05-01 NOTE — Addendum Note (Signed)
 Addended by: Jomarie Longs on: 05/01/2023 02:47 PM   Modules accepted: Orders

## 2023-05-06 ENCOUNTER — Telehealth (INDEPENDENT_AMBULATORY_CARE_PROVIDER_SITE_OTHER): Payer: Self-pay | Admitting: Physician Assistant

## 2023-05-06 VITALS — BP 135/94 | HR 77

## 2023-05-06 DIAGNOSIS — R5383 Other fatigue: Secondary | ICD-10-CM

## 2023-05-06 DIAGNOSIS — Z1231 Encounter for screening mammogram for malignant neoplasm of breast: Secondary | ICD-10-CM

## 2023-05-06 DIAGNOSIS — N951 Menopausal and female climacteric states: Secondary | ICD-10-CM

## 2023-05-06 DIAGNOSIS — Z720 Tobacco use: Secondary | ICD-10-CM

## 2023-05-06 DIAGNOSIS — Z13220 Encounter for screening for lipoid disorders: Secondary | ICD-10-CM

## 2023-05-06 DIAGNOSIS — F331 Major depressive disorder, recurrent, moderate: Secondary | ICD-10-CM

## 2023-05-06 DIAGNOSIS — F988 Other specified behavioral and emotional disorders with onset usually occurring in childhood and adolescence: Secondary | ICD-10-CM | POA: Insufficient documentation

## 2023-05-06 DIAGNOSIS — I10 Essential (primary) hypertension: Secondary | ICD-10-CM

## 2023-05-06 DIAGNOSIS — F902 Attention-deficit hyperactivity disorder, combined type: Secondary | ICD-10-CM

## 2023-05-06 DIAGNOSIS — R002 Palpitations: Secondary | ICD-10-CM

## 2023-05-06 DIAGNOSIS — E039 Hypothyroidism, unspecified: Secondary | ICD-10-CM

## 2023-05-06 DIAGNOSIS — E538 Deficiency of other specified B group vitamins: Secondary | ICD-10-CM

## 2023-05-06 DIAGNOSIS — F39 Unspecified mood [affective] disorder: Secondary | ICD-10-CM

## 2023-05-06 DIAGNOSIS — F332 Major depressive disorder, recurrent severe without psychotic features: Secondary | ICD-10-CM

## 2023-05-06 MED ORDER — LISINOPRIL 10 MG PO TABS: 10.0000 mg | ORAL_TABLET | Freq: Every day | ORAL | 3 refills | Status: DC

## 2023-05-06 MED ORDER — METOPROLOL SUCCINATE ER 50 MG PO TB24: 75.0000 mg | ORAL_TABLET | Freq: Every day | ORAL | 3 refills | Status: DC

## 2023-05-06 NOTE — Progress Notes (Signed)
 ..Virtual Visit via Video Note  I connected with Stacey Alvarado on 05/07/23 at 11:10 AM EST by a video enabled telemedicine application and verified that I am speaking with the correct person using two identifiers.  Location: Patient: work Provider: clinic  .SABRAParticipating in visit:  Patient: Stacey Alvarado Provider: Vermell Bologna PA-C Provider in training: Vernell Gate PA-S   I discussed the limitations of evaluation and management by telemedicine and the availability of in person appointments. The patient expressed understanding and agreed to proceed.  History of Present Illness: Pt calls into clinic for refills. She is struggling to get off work due to new job. She is doing ok. Her mood is still not controlled. She is going to [facility] and suggested her hormones be checked. She has had hysterectomy and no bleeding. Denies any CP, palpitations, headaches, vision changes. She continues to smoke.   .. 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   Vitamin D  deficiency 05/07/2014   Primary hypertension 01/17/2015   Class 1 obesity due to excess calories without serious comorbidity in adult 02/14/2016   No energy 08/13/2016   B12 deficiency 08/14/2016   Single skin nodule 12/10/2016   Left hand paresthesia 12/10/2016   Severe episode of recurrent major depressive disorder, without psychotic features (HCC) 05/10/2019   Mood disorder (HCC) 05/10/2019   Trouble in sleeping 10/07/2019   Diarrhea 11/20/2019   Palpitations 05/15/2021   Vapes nicotine containing substance 05/19/2021   Left-sided chest pain 05/22/2021    Menopausal symptoms 05/06/2023   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    Assessment and Plan: SABRASABRADiagnoses and all orders for this visit:  Primary hypertension -     metoprolol  succinate (TOPROL -XL) 50 MG 24 hr tablet; Take 1.5 tablets (75 mg total) by mouth daily. -     lisinopril  (ZESTRIL ) 10 MG tablet; Take 1 tablet (10 mg total) by mouth daily. -     CMP14+EGFR  Palpitations -     metoprolol  succinate (TOPROL -XL) 50 MG 24 hr tablet; Take 1.5 tablets (75 mg total) by mouth daily. -     CMP14+EGFR -     VITAMIN D  25 Hydroxy (Vit-D Deficiency, Fractures)  Visit for screening mammogram -     MM 3D SCREENING MAMMOGRAM BILATERAL BREAST  No energy -     FSH/LH -     Estradiol  -     Testosterone -     Progesterone -     B12 and Folate Panel -     VITAMIN D  25 Hydroxy (Vit-D Deficiency, Fractures)  Attention deficit hyperactivity disorder (ADHD), combined type -  CMP14+EGFR  Severe episode of recurrent major depressive disorder, without psychotic features (HCC) -     CMP14+EGFR -     Lipid panel -     TSH + free T4 -     FSH/LH -     Estradiol  -     Testosterone -     Progesterone -     B12 and Folate Panel -     VITAMIN D  25 Hydroxy (Vit-D Deficiency, Fractures) -     MM 3D SCREENING MAMMOGRAM BILATERAL BREAST  Vapes nicotine containing substance  B12 deficiency -     B12 and Folate Panel  Hypothyroidism, unspecified type -     TSH + free T4  Screening for lipid  disorders -     Lipid panel  Mood disorder (HCC)  Moderate episode of recurrent major depressive disorder (HCC)  Menopausal symptoms   BP not to goal Increase lisionpril to 10mg  daily Continue to monitor BP goal under 130/80  Pt agrees to get labs at [facility] over weekend  Discussed HRT but she is not a candidate until she stops smoking Mammogram ordered Ordered screening labs and hormone panel   Follow Up Instructions:    I discussed the assessment and treatment plan with the patient. The patient was provided an opportunity to ask questions and all were answered. The patient agreed with the plan and demonstrated an understanding of the instructions.   The patient was advised to call back or seek an in-person evaluation if the symptoms worsen or if the condition fails to improve as anticipated.   Asheton Scheffler, PA-C

## 2023-05-07 ENCOUNTER — Encounter: Payer: Self-pay | Admitting: Physician Assistant

## 2023-06-19 ENCOUNTER — Encounter: Payer: Self-pay | Admitting: Physician Assistant

## 2023-06-19 DIAGNOSIS — F988 Other specified behavioral and emotional disorders with onset usually occurring in childhood and adolescence: Secondary | ICD-10-CM

## 2023-06-21 MED ORDER — AMPHETAMINE-DEXTROAMPHETAMINE 20 MG PO TABS
20.0000 mg | ORAL_TABLET | Freq: Two times a day (BID) | ORAL | 0 refills | Status: DC
Start: 2023-06-21 — End: 2023-06-25

## 2023-06-25 ENCOUNTER — Telehealth (INDEPENDENT_AMBULATORY_CARE_PROVIDER_SITE_OTHER): Payer: Self-pay | Admitting: Physician Assistant

## 2023-06-25 ENCOUNTER — Encounter: Payer: Self-pay | Admitting: Physician Assistant

## 2023-06-25 VITALS — BP 112/72

## 2023-06-25 DIAGNOSIS — F39 Unspecified mood [affective] disorder: Secondary | ICD-10-CM

## 2023-06-25 DIAGNOSIS — F988 Other specified behavioral and emotional disorders with onset usually occurring in childhood and adolescence: Secondary | ICD-10-CM | POA: Diagnosis not present

## 2023-06-25 DIAGNOSIS — F331 Major depressive disorder, recurrent, moderate: Secondary | ICD-10-CM

## 2023-06-25 DIAGNOSIS — I10 Essential (primary) hypertension: Secondary | ICD-10-CM

## 2023-06-25 MED ORDER — AMPHETAMINE-DEXTROAMPHETAMINE 20 MG PO TABS: 20.0000 mg | ORAL_TABLET | Freq: Two times a day (BID) | ORAL | 0 refills | Status: DC

## 2023-06-25 MED ORDER — DULOXETINE HCL 60 MG PO CPEP: 60.0000 mg | ORAL_CAPSULE | Freq: Two times a day (BID) | ORAL | 1 refills | Status: DC

## 2023-06-25 NOTE — Progress Notes (Signed)
 ..Virtual Visit via Video Note  I connected with Stacey Alvarado on 06/25/23 at 10:30 AM EST by a video enabled telemedicine application and verified that I am speaking with the correct person using two identifiers.  Location: Patient: work Provider: clinic  .Marland KitchenParticipating in visit:  Patient: Stacey Alvarado Provider: Tandy Gaw PA-C   I discussed the limitations of evaluation and management by telemedicine and the availability of in person appointments. The patient expressed understanding and agreed to proceed.  History of Present Illness: Pt is a 53 yo female who needs refills on medications.   Lisinopril was increased to 10mg  and BP doing better. Running 110s over 70s at home. Denies any CP, palpitations, headaches or vision changes. She is also taking metoprolol.   Continues to smoke.   Mood is stable. She is on cymbalta and doing ok. She is taking adderall for focus.   .. 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   Vitamin D deficiency 05/07/2014   Primary hypertension 01/17/2015   Class 1 obesity due to excess calories without serious comorbidity in adult 02/14/2016   No energy 08/13/2016   B12 deficiency 08/14/2016   Single skin nodule 12/10/2016   Left hand paresthesia 12/10/2016   Severe episode of recurrent major depressive disorder, without psychotic features (HCC) 05/10/2019   Mood disorder (HCC) 05/10/2019   Trouble in sleeping 10/07/2019   Diarrhea 11/20/2019   Palpitations 05/15/2021   Vapes nicotine containing substance 05/19/2021   Left-sided chest pain 05/22/2021   Menopausal  symptoms 05/06/2023   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   .. Today's Vitals   06/25/23 1025  BP: 112/72   There is no height or weight on file to calculate BMI.   .    06/25/2023   10:25 AM 04/06/2022    9:41 AM 05/19/2021    3:06 PM 05/15/2021    8:00 AM 01/18/2021    1:33 PM  Depression screen PHQ 2/9  Decreased Interest 2  3 3 3   Down, Depressed, Hopeless 1  3 2 2   PHQ - 2 Score 3  6 5 5   Altered sleeping 0  1 2 0  Tired, decreased energy 2  2 1 3   Change in appetite 2  2 1 2   Feeling bad or failure about yourself  1  2 2  0  Trouble concentrating --  2 1 0  Moving slowly or fidgety/restless 0  0 0 0  Suicidal thoughts 0  0 0 0  PHQ-9 Score 8  15 12 10   Difficult doing work/chores Somewhat difficult  Very difficult  Very difficult     Information is confidential and restricted. Go to Review Flowsheets to unlock data.   ..    06/25/2023   10:28  AM 05/19/2021    3:06 PM 05/15/2021    8:02 AM 01/18/2021    1:35 PM  GAD 7 : Generalized Anxiety Score  Nervous, Anxious, on Edge 1 2 2 2   Control/stop worrying 0 1 1 0  Worry too much - different things 2 1 2  0  Trouble relaxing 2 1 2 3   Restless 1 1 1  0  Easily annoyed or irritable 3 2 2 3   Afraid - awful might happen 0 1 1 0  Total GAD 7 Score 9 9 11 8   Anxiety Difficulty Somewhat difficult Very difficult  Very difficult       Assessment and Plan: Marland KitchenMarland KitchenDiagnoses and all orders for this visit:  Primary hypertension  Moderate episode of recurrent major depressive disorder (HCC) -     DULoxetine (CYMBALTA) 60 MG capsule; Take 1 capsule (60  mg total) by mouth 2 (two) times daily.  Mood disorder (HCC) -     DULoxetine (CYMBALTA) 60 MG capsule; Take 1 capsule (60 mg total) by mouth 2 (two) times daily.  Attention deficit disorder (ADD) without hyperactivity -     amphetamine-dextroamphetamine (ADDERALL) 20 MG tablet; Take 1 tablet (20 mg total) by mouth 2 (two) times daily. -     amphetamine-dextroamphetamine (ADDERALL) 20 MG tablet; Take 1 tablet (20 mg total) by mouth 2 (two) times daily. -     amphetamine-dextroamphetamine (ADDERALL) 20 MG tablet; Take 1 tablet (20 mg total) by mouth 2 (two) times daily.   Vitals look great, continue lisinopril 10mg  daily.  Refilled adderall for 3 months.  PHQ and GAD not quite to goal but stable.  Refilled cymbalta.  Follow up in 3 months.   Declined smoking cessation.     Follow Up Instructions:    I discussed the assessment and treatment plan with the patient. The patient was provided an opportunity to ask questions and all were answered. The patient agreed with the plan and demonstrated an understanding of the instructions.   The patient was advised to call back or seek an in-person evaluation if the symptoms worsen or if the condition fails to improve as anticipated.    Tandy Gaw, PA-C

## 2023-10-16 ENCOUNTER — Encounter: Payer: Self-pay | Admitting: Physician Assistant

## 2023-10-16 DIAGNOSIS — F988 Other specified behavioral and emotional disorders with onset usually occurring in childhood and adolescence: Secondary | ICD-10-CM

## 2023-10-16 MED ORDER — AMPHETAMINE-DEXTROAMPHETAMINE 20 MG PO TABS: 20.0000 mg | ORAL_TABLET | Freq: Two times a day (BID) | ORAL | 0 refills | Status: DC

## 2023-10-16 NOTE — Telephone Encounter (Signed)
 Filled 30-day supply submitted lets try to get her on the schedule the third week of July to follow-up with Jade

## 2023-11-18 ENCOUNTER — Other Ambulatory Visit: Payer: Self-pay | Admitting: Physician Assistant

## 2023-11-18 DIAGNOSIS — F988 Other specified behavioral and emotional disorders with onset usually occurring in childhood and adolescence: Secondary | ICD-10-CM

## 2023-11-18 NOTE — Telephone Encounter (Signed)
 Copied from CRM [id number]. Topic: Clinical - Medication Refill >> Nov 18, 2023 12:01 PM Cherylann S wrote: Medication: amphetamine -dextroamphetamine  (ADDERALL) 20 MG tablet   Patient would like enough to last until her appt on 08/05 as she is out of the medication  Has the patient contacted their pharmacy? No (Agent: If no, request that the patient contact the pharmacy for the refill. If patient does not wish to contact the pharmacy document the reason why and proceed with request.) (Agent: If yes, when and what did the pharmacy advise?) No refills   This is the patient's preferred pharmacy:  [facility] [place], [place] - [address] [place] [address] [place] [address] Phone: [phone] Fax: [phone]  Is this the correct pharmacy for this prescription? Yes If no, delete pharmacy and type the correct one.   Has the prescription been filled recently? No  Is the patient out of the medication? Yes  Has the patient been seen for an appointment in the last year OR does the patient have an upcoming appointment? Yes  Can we respond through MyChart? Yes  Agent: Please be advised that Rx refills may take up to 3 business days. We ask that you follow-up with your pharmacy.

## 2023-11-20 MED ORDER — AMPHETAMINE-DEXTROAMPHETAMINE 20 MG PO TABS: 20.0000 mg | ORAL_TABLET | Freq: Two times a day (BID) | ORAL | 0 refills | Status: DC

## 2023-11-20 NOTE — Telephone Encounter (Signed)
 Last filled 10/16/2023  Last OV 03/09/2022  Upcoming appointment 11/26/2023

## 2023-11-26 ENCOUNTER — Ambulatory Visit: Admitting: Physician Assistant

## 2023-12-10 ENCOUNTER — Encounter: Payer: Self-pay | Admitting: Physician Assistant

## 2023-12-10 ENCOUNTER — Telehealth (INDEPENDENT_AMBULATORY_CARE_PROVIDER_SITE_OTHER): Payer: Self-pay | Admitting: Physician Assistant

## 2023-12-10 VITALS — BP 120/78 | Ht 68.0 in | Wt 200.0 lb

## 2023-12-10 DIAGNOSIS — G894 Chronic pain syndrome: Secondary | ICD-10-CM

## 2023-12-10 DIAGNOSIS — F988 Other specified behavioral and emotional disorders with onset usually occurring in childhood and adolescence: Secondary | ICD-10-CM

## 2023-12-10 DIAGNOSIS — F39 Unspecified mood [affective] disorder: Secondary | ICD-10-CM

## 2023-12-10 DIAGNOSIS — F411 Generalized anxiety disorder: Secondary | ICD-10-CM | POA: Insufficient documentation

## 2023-12-10 DIAGNOSIS — F332 Major depressive disorder, recurrent severe without psychotic features: Secondary | ICD-10-CM

## 2023-12-10 DIAGNOSIS — F331 Major depressive disorder, recurrent, moderate: Secondary | ICD-10-CM

## 2023-12-10 MED ORDER — AMPHETAMINE-DEXTROAMPHETAMINE 20 MG PO TABS: 20.0000 mg | ORAL_TABLET | Freq: Two times a day (BID) | ORAL | 0 refills | Status: DC

## 2023-12-10 MED ORDER — LAMOTRIGINE 25 MG PO TABS: 25.0000 mg | ORAL_TABLET | Freq: Every day | ORAL | 2 refills | Status: DC

## 2023-12-10 MED ORDER — DULOXETINE HCL 60 MG PO CPEP: 60.0000 mg | ORAL_CAPSULE | Freq: Two times a day (BID) | ORAL | 1 refills | Status: DC

## 2023-12-10 MED ORDER — AMPHETAMINE-DEXTROAMPHETAMINE 20 MG PO TABS
20.0000 mg | ORAL_TABLET | Freq: Two times a day (BID) | ORAL | 0 refills | Status: DC
Start: 2024-02-08 — End: 2024-03-16

## 2023-12-10 NOTE — Progress Notes (Unsigned)
 ..Virtual Visit via Video Note  I connected with Stacey Alvarado on 12/11/23 at  1:40 PM EDT by a video enabled telemedicine application and verified that I am speaking with the correct person using two identifiers.  Location: Patient: home Provider: clinic  .SABRAParticipating in visit:  Patient: Stacey Alvarado Provider: Vermell Bologna PA-C   I discussed the limitations of evaluation and management by telemedicine and the availability of in person appointments. The patient expressed understanding and agreed to proceed.  History of Present Illness: Pt is a 53 yo female who presents to the clinic via video to get medication refills.   ADHD symptoms are controlled. She is doing well at work and fill like current dose is doing well. She is sleeping well and denies any palpitations, headaches or vision changes. She is checking her BP at home as needed and using getting 120s over 70s.   She continues to struggle with depression. She is on Cymbalta  and self pay now and cannot afford to go back to [facility]. She wonders if there is anything she can add to cymbalta  to help with mood that is affordable. She has tried numerous medications in the past.     Observations/Objective: No acute distress Normal breathing Normal appearance Flat affect  .SABRA    12/10/2023    1:35 PM 06/25/2023   10:25 AM 04/06/2022    9:41 AM 05/19/2021    3:06 PM 05/15/2021    8:00 AM  Depression screen PHQ 2/9  Decreased Interest 3 2  3 3   Down, Depressed, Hopeless 2 1  3 2   PHQ - 2 Score 5 3  6 5   Altered sleeping 0 0  1 2  Tired, decreased energy 2 2  2 1   Change in appetite 0 2  2 1   Feeling bad or failure about yourself  0 1  2 2   Trouble concentrating 1 --  2 1  Moving slowly or fidgety/restless 0 0  0 0  Suicidal thoughts 0 0  0 0  PHQ-9 Score 8 8  15 12   Difficult doing work/chores Not difficult at all Somewhat difficult  Very difficult      Information is confidential and restricted. Go to Review Flowsheets to unlock  data.   ..    12/10/2023    1:37 PM 06/25/2023   10:28 AM 05/19/2021    3:06 PM 05/15/2021    8:02 AM  GAD 7 : Generalized Anxiety Score  Nervous, Anxious, on Edge 2 1 2 2   Control/stop worrying 1 0 1 1  Worry too much - different things 1 2 1 2   Trouble relaxing 1 2 1 2   Restless 2 1 1 1   Easily annoyed or irritable 3 3 2 2   Afraid - awful might happen 1 0 1 1  Total GAD 7 Score 11 9 9 11   Anxiety Difficulty Somewhat difficult Somewhat difficult Very difficult     Assessment and Plan: SABRASABRADamary was seen today for medical management of chronic issues.  Diagnoses and all orders for this visit:  Attention deficit disorder (ADD) without hyperactivity -     amphetamine -dextroamphetamine  (ADDERALL) 20 MG tablet; Take 1 tablet (20 mg total) by mouth 2 (two) times daily. -     amphetamine -dextroamphetamine  (ADDERALL) 20 MG tablet; Take 1 tablet (20 mg total) by mouth 2 (two) times daily. -     amphetamine -dextroamphetamine  (ADDERALL) 20 MG tablet; Take 1 tablet (20 mg total) by mouth 2 (two) times daily.  Moderate episode of  recurrent major depressive disorder (HCC) -     DULoxetine  (CYMBALTA ) 60 MG capsule; Take 1 capsule (60 mg total) by mouth 2 (two) times daily. -     lamoTRIgine  (LAMICTAL ) 25 MG tablet; Take 1 tablet (25 mg total) by mouth daily.  Mood disorder (HCC) -     DULoxetine  (CYMBALTA ) 60 MG capsule; Take 1 capsule (60 mg total) by mouth 2 (two) times daily. -     lamoTRIgine  (LAMICTAL ) 25 MG tablet; Take 1 tablet (25 mg total) by mouth daily.  GAD (generalized anxiety disorder) -     lamoTRIgine  (LAMICTAL ) 25 MG tablet; Take 1 tablet (25 mg total) by mouth daily.  Chronic pain syndrome -     DULoxetine  (CYMBALTA ) 60 MG capsule; Take 1 capsule (60 mg total) by mouth 2 (two) times daily.   Pt sees Dr. Garvin for chronic pain and manages medication.   Adderall refilled for 3 months Vitals look great  PHQ and GAD numbers stable but not controlled Refilled  cymbalta  Started lamictal  for adjunct, discussed side effects such as skin rash if she has any to let us  know and stop medication Follow up in 3 months  Due to no insurance declines all health prevention at this time.     Follow Up Instructions:    I discussed the assessment and treatment plan with the patient. The patient was provided an opportunity to ask questions and all were answered. The patient agreed with the plan and demonstrated an understanding of the instructions.   The patient was advised to call back or seek an in-person evaluation if the symptoms worsen or if the condition fails to improve as anticipated.   Uma Jerde, PA-C

## 2023-12-11 ENCOUNTER — Encounter: Payer: Self-pay | Admitting: Physician Assistant

## 2023-12-11 DIAGNOSIS — G894 Chronic pain syndrome: Secondary | ICD-10-CM | POA: Insufficient documentation

## 2023-12-19 ENCOUNTER — Encounter: Payer: Self-pay | Admitting: Physician Assistant

## 2024-01-21 ENCOUNTER — Encounter: Payer: Self-pay | Admitting: Physician Assistant

## 2024-01-22 NOTE — Telephone Encounter (Signed)
 Patient requesting rx rf of Lamictal  with strength increase to [facility] pharmacy  Last written as 25mg  Last OV 12/10/2023 telemedicine Upcoming appt = none

## 2024-01-28 ENCOUNTER — Telehealth: Payer: Self-pay | Admitting: Physician Assistant

## 2024-01-28 ENCOUNTER — Other Ambulatory Visit: Payer: Self-pay

## 2024-01-28 DIAGNOSIS — F411 Generalized anxiety disorder: Secondary | ICD-10-CM

## 2024-01-28 DIAGNOSIS — F39 Unspecified mood [affective] disorder: Secondary | ICD-10-CM

## 2024-01-28 DIAGNOSIS — F331 Major depressive disorder, recurrent, moderate: Secondary | ICD-10-CM

## 2024-01-28 MED ORDER — LAMOTRIGINE 25 MG PO TABS: 75.0000 mg | ORAL_TABLET | Freq: Every day | ORAL | 1 refills | Status: DC

## 2024-01-28 NOTE — Telephone Encounter (Signed)
 Copied from CRM [id number]. Topic: Clinical - Medication Question >> Jan 28, 2024 11:54 AM Winona R wrote: Pt calling to follow up on her medication quest sent in mychart on 09/30 Pt is now completely out of the medication.   Patient: I have been on the 2 tablets a day since labor day. I think it may be helping slightly but I do know I'm on very low dose still. Can we please increase the dose again? If so please send to [facility] pharmacy. Thanks.

## 2024-01-28 NOTE — Telephone Encounter (Signed)
 Patient called she is requesting a refill on her Lamictal  25mg 

## 2024-01-28 NOTE — Addendum Note (Signed)
 Addended by: ANTONIETTE VERMELL CROME on: 01/28/2024 04:09 PM   Modules accepted: Orders

## 2024-01-28 NOTE — Telephone Encounter (Signed)
 2nd refill request -   Patient is currently out of this medication. Requesting an increase on the dose. Please advise, thanks.

## 2024-01-30 ENCOUNTER — Ambulatory Visit: Payer: Self-pay | Admitting: Podiatry

## 2024-02-06 ENCOUNTER — Encounter: Payer: Self-pay | Admitting: Podiatry

## 2024-02-06 ENCOUNTER — Ambulatory Visit (INDEPENDENT_AMBULATORY_CARE_PROVIDER_SITE_OTHER): Payer: Self-pay | Admitting: Podiatry

## 2024-02-06 ENCOUNTER — Telehealth: Payer: Self-pay | Admitting: *Deleted

## 2024-02-06 DIAGNOSIS — M722 Plantar fascial fibromatosis: Secondary | ICD-10-CM

## 2024-02-06 NOTE — Patient Instructions (Signed)

## 2024-02-06 NOTE — Progress Notes (Signed)
  Subjective:  Patient ID: JANIKA JEDLICKA, female    DOB: [birth year],   MRN: [id number]  Chief Complaint  Patient presents with   Foot Pain    It's my right foot, my heel has been hurting for about six months.  I'm starting to have pain in my ankle.  I went to [facility] and they did xrays.  They said I have a spur.    53 y.o. female presents for concern as above. Denies any current treatments . Denies any other pedal complaints. Denies n/v/f/c.   Past Medical History:  Diagnosis Date   Back pain    Headache    HTN (hypertension)     Objective:  Physical Exam: Vascular: DP/PT pulses 2/4 bilateral. CFT <3 seconds. Normal hair growth on digits. No edema.  Skin. No lacerations or abrasions bilateral feet.  Musculoskeletal: MMT 5/5 bilateral lower extremities in DF, PF, Inversion and Eversion. Deceased ROM in DF of ankle joint. Tender to the medial calcaneal tubercle right . No pain with achilles, PT or arch. No pain with calcaneal squeeze.  Neurological: Sensation intact to light touch.   Assessment:   1. Plantar fasciitis, right [M72.2]      Plan:  Patient was evaluated and treated and all questions answered. Discussed plantar fasciitis with patient.  X-rays reviewed and discussed with patient. No acute fractures or dislocations noted. Mild spurring noted at inferior calcaneus.  Discussed treatment options including, ice, NSAIDS, supportive shoes, bracing, and stretching. Stretching exercises provided to be done on a daily basis.   Will start meloxicam  she already has at home. Advised on how to take.  PF brace dispenesed.  Follow-up 6 weeks or sooner if any problems arise. In the meantime, encouraged to call the office with any questions, concerns, change in symptoms.     Asberry Failing, DPM

## 2024-02-06 NOTE — Telephone Encounter (Signed)
 I attempted to call the patient to inform her that we will need xrays of her foot.  I left a message asking her to arrive a few minutes early and stop by imaging first then come to us  for her appointment.

## 2024-02-14 MED ORDER — LAMOTRIGINE 100 MG PO TABS: 100.0000 mg | ORAL_TABLET | Freq: Two times a day (BID) | ORAL | 1 refills | Status: DC

## 2024-02-14 NOTE — Addendum Note (Signed)
 Addended by: ANTONIETTE VERMELL CROME on: 02/14/2024 10:35 AM   Modules accepted: Orders

## 2024-03-16 ENCOUNTER — Encounter: Payer: Self-pay | Admitting: Physician Assistant

## 2024-03-16 DIAGNOSIS — F988 Other specified behavioral and emotional disorders with onset usually occurring in childhood and adolescence: Secondary | ICD-10-CM

## 2024-03-16 MED ORDER — AMPHETAMINE-DEXTROAMPHETAMINE 20 MG PO TABS: 20.0000 mg | ORAL_TABLET | Freq: Two times a day (BID) | ORAL | 0 refills | Status: DC

## 2024-03-26 ENCOUNTER — Ambulatory Visit (INDEPENDENT_AMBULATORY_CARE_PROVIDER_SITE_OTHER): Payer: Self-pay | Admitting: Podiatry

## 2024-03-26 DIAGNOSIS — Z91199 Patient's noncompliance with other medical treatment and regimen due to unspecified reason: Secondary | ICD-10-CM

## 2024-03-26 NOTE — Progress Notes (Signed)
 No show

## 2024-04-09 ENCOUNTER — Other Ambulatory Visit: Payer: Self-pay | Admitting: Physician Assistant

## 2024-04-10 ENCOUNTER — Other Ambulatory Visit: Payer: Self-pay | Admitting: Physician Assistant

## 2024-04-10 DIAGNOSIS — F988 Other specified behavioral and emotional disorders with onset usually occurring in childhood and adolescence: Secondary | ICD-10-CM

## 2024-04-10 NOTE — Telephone Encounter (Unsigned)
 Copied from CRM #[id number]. Topic: Clinical - Medication Refill >> Apr 10, 2024  2:01 PM Selinda RAMAN wrote: Medication: amphetamine -dextroamphetamine  (ADDERALL) 20 MG tablet  Has the patient contacted their pharmacy? No   This is the patient's preferred pharmacy:  [facility] [place], [place] - [address] [place] [address] [place] [address] Phone: [phone] Fax: [phone]  Is this the correct pharmacy for this prescription? No If no, delete pharmacy and type the correct one.   Has the prescription been filled recently? No  Is the patient out of the medication? No but she has less than a week left  Has the patient been seen for an appointment in the last year OR does the patient have an upcoming appointment? Yes  Can we respond through MyChart? Yes  Please assist patient further. She has an appointment in January which was the very first available appointment. She is just requesting at least enough to get her to her appointment

## 2024-04-13 MED ORDER — AMPHETAMINE-DEXTROAMPHETAMINE 20 MG PO TABS: 20.0000 mg | ORAL_TABLET | Freq: Two times a day (BID) | ORAL | 0 refills | Status: DC

## 2024-04-13 NOTE — Telephone Encounter (Signed)
 Attempted call to patient to inform her to keep appt scheduled for 04/28/2024 as she was given a 30 day supply to last until this appt time. Left a voice mail message requesting a return call.

## 2024-04-28 ENCOUNTER — Telehealth: Payer: Self-pay

## 2024-04-28 ENCOUNTER — Encounter: Payer: Self-pay | Admitting: Physician Assistant

## 2024-04-28 ENCOUNTER — Telehealth: Payer: Self-pay | Admitting: Physician Assistant

## 2024-04-28 VITALS — BP 120/78 | Wt 215.0 lb

## 2024-04-28 DIAGNOSIS — F988 Other specified behavioral and emotional disorders with onset usually occurring in childhood and adolescence: Secondary | ICD-10-CM

## 2024-04-28 DIAGNOSIS — G894 Chronic pain syndrome: Secondary | ICD-10-CM

## 2024-04-28 DIAGNOSIS — F411 Generalized anxiety disorder: Secondary | ICD-10-CM

## 2024-04-28 DIAGNOSIS — I10 Essential (primary) hypertension: Secondary | ICD-10-CM

## 2024-04-28 DIAGNOSIS — Z6832 Body mass index (BMI) 32.0-32.9, adult: Secondary | ICD-10-CM

## 2024-04-28 DIAGNOSIS — E66812 Obesity, class 2: Secondary | ICD-10-CM

## 2024-04-28 DIAGNOSIS — R002 Palpitations: Secondary | ICD-10-CM

## 2024-04-28 DIAGNOSIS — Z131 Encounter for screening for diabetes mellitus: Secondary | ICD-10-CM

## 2024-04-28 DIAGNOSIS — F39 Unspecified mood [affective] disorder: Secondary | ICD-10-CM

## 2024-04-28 DIAGNOSIS — F331 Major depressive disorder, recurrent, moderate: Secondary | ICD-10-CM

## 2024-04-28 DIAGNOSIS — E66811 Obesity, class 1: Secondary | ICD-10-CM

## 2024-04-28 DIAGNOSIS — Z13220 Encounter for screening for lipoid disorders: Secondary | ICD-10-CM

## 2024-04-28 DIAGNOSIS — R7301 Impaired fasting glucose: Secondary | ICD-10-CM

## 2024-04-28 MED ORDER — LAMOTRIGINE 100 MG PO TABS: 100.0000 mg | ORAL_TABLET | Freq: Two times a day (BID) | ORAL | 1 refills | Status: AC

## 2024-04-28 MED ORDER — AMPHETAMINE-DEXTROAMPHETAMINE 20 MG PO TABS: 20.0000 mg | ORAL_TABLET | Freq: Two times a day (BID) | ORAL | 0 refills | Status: DC

## 2024-04-28 MED ORDER — CARIPRAZINE HCL 1.5 MG PO CAPS: 1.5000 mg | ORAL_CAPSULE | Freq: Every day | ORAL | 2 refills | Status: AC

## 2024-04-28 MED ORDER — LISINOPRIL 10 MG PO TABS: 10.0000 mg | ORAL_TABLET | Freq: Every day | ORAL | 3 refills | Status: AC

## 2024-04-28 MED ORDER — METOPROLOL SUCCINATE ER 50 MG PO TB24: 75.0000 mg | ORAL_TABLET | Freq: Every day | ORAL | 3 refills | Status: AC

## 2024-04-28 MED ORDER — DULOXETINE HCL 60 MG PO CPEP: 60.0000 mg | ORAL_CAPSULE | Freq: Two times a day (BID) | ORAL | 1 refills | Status: AC

## 2024-04-28 NOTE — Telephone Encounter (Signed)
 Copied from CRM [id number]. Topic: Clinical - Prescription Issue >> Apr 28, 2024  4:14 PM [name] wrote: Reason for CRM: Patient stated that her previous refill for amphetamine -dextroamphetamine  (ADDERALL) 20 MG tablet was on 04/12/24 but was only for two weeks supply until her appointment and the new prescription which was sent in today 04/28/24 was dated for 05/13/24 but she will be out of meds by then. Patient is requesting for Date to be changed for this prescription.   [facility] - [place], [place] - [address] [place] [address], [place] [address] Phone: [phone]  Fax: [phone]

## 2024-04-29 ENCOUNTER — Other Ambulatory Visit: Payer: Self-pay

## 2024-04-29 DIAGNOSIS — F988 Other specified behavioral and emotional disorders with onset usually occurring in childhood and adolescence: Secondary | ICD-10-CM

## 2024-04-29 MED ORDER — AMPHETAMINE-DEXTROAMPHETAMINE 20 MG PO TABS: 20.0000 mg | ORAL_TABLET | Freq: Two times a day (BID) | ORAL | 0 refills | Status: AC

## 2024-04-29 NOTE — Telephone Encounter (Signed)
 I re-sent all 3 to get pick up now.

## 2024-04-29 NOTE — Telephone Encounter (Signed)
 Copied from CRM [id number]. Topic: Clinical - Prescription Issue >> Apr 28, 2024  4:14 PM [name] wrote: Reason for CRM: Patient stated that her previous refill for amphetamine -dextroamphetamine  (ADDERALL) 20 MG tablet was on 04/12/24 but was only for two weeks supply until her appointment and the new prescription which was sent in today 04/28/24 was dated for 05/13/24 but she will be out of meds by then. Patient is requesting for Date to be changed for this prescription.   [facility] - [place], [place] - [address] [place], [place] [address] Phone: [phone]  Fax: [phone] >> Apr 29, 2024  1:36 PM [name] wrote: Pt following up on this issue- Pt is out of adderall and future refill was put in for 05/13/24. She says that Jade probably put the refill in like the because she did not realize that the last fill of her medication she picked up was only a 15 day supply. She is requesting the medication to be picked up for as soon as possible since she has no more left. #[id number]

## 2024-04-29 NOTE — Telephone Encounter (Signed)
 Pended Adderall 20mg   for refill Chart does show that on 04/13/2024 a 15 day supply of this was sent to pharmacy .  Patient had telehealth visit yesterday 04/28/2024

## 2024-04-29 NOTE — Telephone Encounter (Signed)
 Copied from CRM [id number]. Topic: Clinical - Prescription Issue >> Apr 28, 2024  4:14 PM [name] wrote: Reason for CRM: Patient stated that her previous refill for amphetamine -dextroamphetamine  (ADDERALL) 20 MG tablet was on 04/12/24 but was only for two weeks supply until her appointment and the new prescription which was sent in today 04/28/24 was dated for 05/13/24 but she will be out of meds by then. Patient is requesting for Date to be changed for this prescription.   [facility] - [place], [place] - [address] [place] [address], [place] [address] Phone: [phone]  Fax: [phone] >> Apr 29, 2024  1:36 PM [name] wrote: Pt following up on this issue- Pt is out of adderall and future refill was put in for 05/13/24. She says that Jade probably put the refill in like the because she did not realize that the last fill of her medication she picked up was only a 15 day supply. She is requesting the medication to be picked up for as soon as possible since she has no more left. #[id number]

## 2024-04-29 NOTE — Telephone Encounter (Signed)
 I resent all the prescriptions to start pick up today.

## 2024-04-30 NOTE — Telephone Encounter (Signed)
 This task has been completed as requested. Left a vm msg for the patient regarding the Adderall rx was sent to the pharmacy. Direct call back information provided.

## 2024-05-01 ENCOUNTER — Telehealth: Payer: Self-pay

## 2024-05-01 ENCOUNTER — Encounter: Payer: Self-pay | Admitting: Physician Assistant

## 2024-05-01 ENCOUNTER — Ambulatory Visit: Payer: Self-pay | Admitting: Physician Assistant

## 2024-05-01 DIAGNOSIS — M2550 Pain in unspecified joint: Secondary | ICD-10-CM

## 2024-05-01 DIAGNOSIS — I10 Essential (primary) hypertension: Secondary | ICD-10-CM

## 2024-05-01 DIAGNOSIS — E6609 Other obesity due to excess calories: Secondary | ICD-10-CM

## 2024-05-01 DIAGNOSIS — E785 Hyperlipidemia, unspecified: Secondary | ICD-10-CM

## 2024-05-01 LAB — CBC WITH DIFFERENTIAL/PLATELET
Basophils Absolute: 0 x10E3/uL (ref 0.0–0.2)
Basos: 0 %
EOS (ABSOLUTE): 0.1 x10E3/uL (ref 0.0–0.4)
Eos: 1 %
Hematocrit: 40.1 % (ref 34.0–46.6)
Hemoglobin: 13.4 g/dL (ref 11.1–15.9)
Immature Grans (Abs): 0 x10E3/uL (ref 0.0–0.1)
Immature Granulocytes: 0 %
Lymphocytes Absolute: 2 x10E3/uL (ref 0.7–3.1)
Lymphs: 25 %
MCH: 30.7 pg (ref 26.6–33.0)
MCHC: 33.4 g/dL (ref 31.5–35.7)
MCV: 92 fL (ref 79–97)
Monocytes Absolute: 0.7 x10E3/uL (ref 0.1–0.9)
Monocytes: 9 %
Neutrophils Absolute: 5.1 x10E3/uL (ref 1.4–7.0)
Neutrophils: 65 %
Platelets: 298 x10E3/uL (ref 150–450)
RBC: 4.37 x10E6/uL (ref 3.77–5.28)
RDW: 12.9 % (ref 11.7–15.4)
WBC: 7.9 x10E3/uL (ref 3.4–10.8)

## 2024-05-01 LAB — CMP14+EGFR
ALT: 35 IU/L — ABNORMAL HIGH (ref 0–32)
AST: 25 IU/L (ref 0–40)
Albumin: 4.6 g/dL (ref 3.8–4.9)
Alkaline Phosphatase: 110 IU/L (ref 49–135)
BUN/Creatinine Ratio: 19 (ref 9–23)
BUN: 16 mg/dL (ref 6–24)
Bilirubin Total: 0.3 mg/dL (ref 0.0–1.2)
CO2: 24 mmol/L (ref 20–29)
Calcium: 10 mg/dL (ref 8.7–10.2)
Chloride: 100 mmol/L (ref 96–106)
Creatinine, Ser: 0.83 mg/dL (ref 0.57–1.00)
Globulin, Total: 2 g/dL (ref 1.5–4.5)
Glucose: 104 mg/dL — ABNORMAL HIGH (ref 70–99)
Potassium: 4.8 mmol/L (ref 3.5–5.2)
Sodium: 138 mmol/L (ref 134–144)
Total Protein: 6.6 g/dL (ref 6.0–8.5)
eGFR: 85 mL/min/1.73

## 2024-05-01 LAB — TSH+FREE T4
Free T4: 1.02 ng/dL (ref 0.82–1.77)
TSH: 0.676 u[IU]/mL (ref 0.450–4.500)

## 2024-05-01 LAB — LIPID PANEL
Chol/HDL Ratio: 5.9 ratio — ABNORMAL HIGH (ref 0.0–4.4)
Cholesterol, Total: 264 mg/dL — ABNORMAL HIGH (ref 100–199)
HDL: 45 mg/dL
LDL Chol Calc (NIH): 165 mg/dL — ABNORMAL HIGH (ref 0–99)
Triglycerides: 287 mg/dL — ABNORMAL HIGH (ref 0–149)
VLDL Cholesterol Cal: 54 mg/dL — ABNORMAL HIGH (ref 5–40)

## 2024-05-01 LAB — HEMOGLOBIN A1C
Est. average glucose Bld gHb Est-mCnc: 120 mg/dL
Hgb A1c MFr Bld: 5.8 % — ABNORMAL HIGH (ref 4.8–5.6)

## 2024-05-01 MED ORDER — ZEPBOUND 2.5 MG/0.5ML ~~LOC~~ SOAJ: 2.5000 mg | SUBCUTANEOUS | 0 refills | Status: AC

## 2024-05-01 NOTE — Progress Notes (Signed)
 Stacey Alvarado,   A1C pre-diabetic.  Thyroid looks good.  Kidney function looks good.  Cholesterol not to goal and elevated.   Yes losing weight would help with all this. The only approved reason is diabetes and sleep apnea for weight loss shots. I can send all this information but we are getting many denials if an insurance company will not cover for weight loss medications. I can try.

## 2024-05-01 NOTE — Telephone Encounter (Signed)
 Would this dx code be  as below?  Class 1 obesity due to excess calories without serious comorbidity in adult Noted 02/14/2016 [Z33.188, E66.09]

## 2024-05-01 NOTE — Telephone Encounter (Signed)
 Copied from CRM #[id number]. Topic: Clinical - Prescription Issue >> May 01, 2024  2:49 PM [name] wrote: Reason for CRM: [facility], stated that they need a diagnosis code for Zepbound .  Callback: [phone]

## 2024-05-04 ENCOUNTER — Encounter: Payer: Self-pay | Admitting: Physician Assistant

## 2024-05-04 NOTE — Progress Notes (Signed)
 .Virtual Visit via Video Note  I connected with Stacey Alvarado on 1.6.2026 at  2:20 PM EST by a video enabled telemedicine application and verified that I am speaking with the correct person using two identifiers.  Location: Patient: work  Provider: clinic  .SABRAParticipating in visit:  Patient: Stacey Alvarado Provider: Vermell Bologna PA-C   I discussed the limitations of evaluation and management by telemedicine and the availability of in person appointments. The patient expressed understanding and agreed to proceed.  History of Present Illness: Discussed the use of AI scribe software for clinical note transcription with the patient, who gave verbal consent to proceed.  History of Present Illness Stacey Alvarado is a 54 year old female with depression who presents for medication management.  Depressive symptoms and medication response - Depression with only slight improvement on Lamictal  100 mg twice daily; not significantly effective - Cymbalta  currently used primarily for pain and depression; previously effective for depression but not currently - History of limited access to medications due to lack of insurance until January 1st - Previous trials of Abilify , Wellbutrin , Seroquel, Lexapro , Zoloft , Trintellix , and amitriptyline  with limited success - Severe headaches experienced with a single dose of Rexulti   Chronic pain management - Cymbalta  used primarily for pain management - Currently taking Celebrex and oxycodone for pain - Lyrica discontinued  Attention deficit symptoms - Continues to take Adderall for attention deficit disorder - Requests refill of Adderall  Blood pressure control - Blood pressure well controlled, usually in the 120s/80s range - Currently taking metoprolol  and lisinopril   Glycemic status and medication inquiry - Inquires about insurance coverage for GLP-1 medications, referencing a friend's experience with elevated A1c and approval - No recent A1c testing  due to previous lack of insurance - Last glucose measurement was elevated      Observations/Objective: No acute distress Normal breathing Normal mood and appearance  Today's Vitals   04/28/24 1447  BP: 120/78  Weight: 215 lb (97.5 kg)   Body mass index is 32.69 kg/m.   Assessment and Plan: SABRASABRAAngeliz was seen today for medical management of chronic issues.  Diagnoses and all orders for this visit:  Attention deficit disorder (ADD) without hyperactivity -     Discontinue: amphetamine -dextroamphetamine  (ADDERALL) 20 MG tablet; Take 1 tablet (20 mg total) by mouth 2 (two) times daily. No further refills, needs appointment. -     Discontinue: amphetamine -dextroamphetamine  (ADDERALL) 20 MG tablet; Take 1 tablet (20 mg total) by mouth 2 (two) times daily. -     Discontinue: amphetamine -dextroamphetamine  (ADDERALL) 20 MG tablet; Take 1 tablet (20 mg total) by mouth 2 (two) times daily. -     amphetamine -dextroamphetamine  (ADDERALL) 20 MG tablet; Take 1 tablet (20 mg total) by mouth 2 (two) times daily. -     amphetamine -dextroamphetamine  (ADDERALL) 20 MG tablet; Take 1 tablet (20 mg total) by mouth 2 (two) times daily. -     amphetamine -dextroamphetamine  (ADDERALL) 20 MG tablet; Take 1 tablet (20 mg total) by mouth 2 (two) times daily. No further refills, needs appointment.  Moderate episode of recurrent major depressive disorder (HCC) -     cariprazine  (VRAYLAR ) 1.5 MG capsule; Take 1 capsule (1.5 mg total) by mouth daily. -     DULoxetine  (CYMBALTA ) 60 MG capsule; Take 1 capsule (60 mg total) by mouth 2 (two) times daily. -     lamoTRIgine  (LAMICTAL ) 100 MG tablet; Take 1 tablet (100 mg total) by mouth 2 (two) times daily.  Mood disorder -  cariprazine  (VRAYLAR ) 1.5 MG capsule; Take 1 capsule (1.5 mg total) by mouth daily. -     DULoxetine  (CYMBALTA ) 60 MG capsule; Take 1 capsule (60 mg total) by mouth 2 (two) times daily. -     lamoTRIgine  (LAMICTAL ) 100 MG tablet; Take 1  tablet (100 mg total) by mouth 2 (two) times daily.  GAD (generalized anxiety disorder)  Chronic pain syndrome -     DULoxetine  (CYMBALTA ) 60 MG capsule; Take 1 capsule (60 mg total) by mouth 2 (two) times daily.  Primary hypertension -     lisinopril  (ZESTRIL ) 10 MG tablet; Take 1 tablet (10 mg total) by mouth daily. -     metoprolol  succinate (TOPROL -XL) 50 MG 24 hr tablet; Take 1.5 tablets (75 mg total) by mouth daily.  Elevated fasting glucose -     Hemoglobin A1c  Screening for diabetes mellitus -     Hemoglobin A1c -     CMP14+EGFR  Screening for lipid disorders -     Lipid panel  Class 1 obesity due to excess calories without serious comorbidity with body mass index (BMI) of 32.0 to 32.9 in adult -     Hemoglobin A1c -     CMP14+EGFR -     Lipid panel -     TSH + free T4 -     CBC w/Diff/Platelet  Palpitations -     metoprolol  succinate (TOPROL -XL) 50 MG 24 hr tablet; Take 1.5 tablets (75 mg total) by mouth daily.    Assessment & Plan Major depressive disorder, recurrent Recurrent depression with limited response to current treatment. Vraylar  considered for its efficacy and insurance approval. - Added Vraylar  1.5 mg as an adjunct to Lamictal  and Cymbalta .  Attention deficit disorder without hyperactivity Continued management with Adderall. - Refilled Adderall prescription.  Chronic pain syndrome Managed with Cymbalta  for pain control.  Primary hypertension Blood pressure well-controlled with current regimen.  Palpitations - controlled on metoprolol   Class 2 obesity Discussed potential insurance coverage for GLP-1 agonists if A1c elevated or sleep apnea documented. - Ordered A1c test to assess for potential insurance coverage for GLP-1 agonists. - discussed zepbound  side effects and tapering up  Impaired fasting glucose Previous elevated glucose levels. A1c reassessment needed for GLP-1 agonist coverage. - Ordered A1c test to reassess glucose  control.  General health maintenance Routine health maintenance discussed. - Ordered lipid panel, comprehensive metabolic panel (CMP), and thyroid function tests.    Follow Up Instructions:    I discussed the assessment and treatment plan with the patient. The patient was provided an opportunity to ask questions and all were answered. The patient agreed with the plan and demonstrated an understanding of the instructions.   The patient was advised to call back or seek an in-person evaluation if the symptoms worsen or if the condition fails to improve as anticipated.    Walda Hertzog, PA-C

## 2024-05-04 NOTE — Telephone Encounter (Signed)
 Karleen with [facility] pharmacy informed.

## 2024-05-06 ENCOUNTER — Ambulatory Visit: Payer: Self-pay | Admitting: Physician Assistant

## 2024-05-06 NOTE — Progress Notes (Signed)
 So far all autoimmune panels are coming back negative and in normal range. ANA pending. Would you like us  to send to Dr. Berline?

## 2024-05-07 LAB — CBC WITH DIFFERENTIAL/PLATELET
Basophils Absolute: 0 x10E3/uL (ref 0.0–0.2)
Basos: 0 %
EOS (ABSOLUTE): 0.1 x10E3/uL (ref 0.0–0.4)
Eos: 1 %
Hematocrit: 37.7 % (ref 34.0–46.6)
Hemoglobin: 12.6 g/dL (ref 11.1–15.9)
Immature Grans (Abs): 0 x10E3/uL (ref 0.0–0.1)
Immature Granulocytes: 0 %
Lymphocytes Absolute: 2.7 x10E3/uL (ref 0.7–3.1)
Lymphs: 35 %
MCH: 30.7 pg (ref 26.6–33.0)
MCHC: 33.4 g/dL (ref 31.5–35.7)
MCV: 92 fL (ref 79–97)
Monocytes Absolute: 0.9 x10E3/uL (ref 0.1–0.9)
Monocytes: 11 %
Neutrophils Absolute: 3.9 x10E3/uL (ref 1.4–7.0)
Neutrophils: 53 %
Platelets: 266 x10E3/uL (ref 150–450)
RBC: 4.1 x10E6/uL (ref 3.77–5.28)
RDW: 12.8 % (ref 11.7–15.4)
WBC: 7.6 x10E3/uL (ref 3.4–10.8)

## 2024-05-07 LAB — SEDIMENTATION RATE: Sed Rate: 9 mm/h (ref 0–40)

## 2024-05-07 LAB — C-REACTIVE PROTEIN: CRP: 2 mg/L (ref 0–10)

## 2024-05-07 LAB — RHEUMATOID FACTOR: Rheumatoid fact SerPl-aCnc: <10 [IU]/mL

## 2024-05-07 LAB — ANA: ANA Titer 1: NEGATIVE

## 2024-05-11 ENCOUNTER — Other Ambulatory Visit (HOSPITAL_COMMUNITY): Payer: Self-pay

## 2024-05-11 ENCOUNTER — Telehealth: Payer: Self-pay

## 2024-05-11 NOTE — Telephone Encounter (Signed)
 Pharmacy Patient Advocate Encounter  Received notification from [facility] that Prior Authorization for Wegovy  0.25mg /0.66ml has been CANCELLED due to the PA has been resolved, no additional PA is required.

## 2024-05-11 NOTE — Telephone Encounter (Signed)
 Pharmacy Patient Advocate Encounter   Received notification from Pt Calls Messages that prior authorization for Wegovy  0.25mg /0.15ml is required/requested.   Insurance verification completed.   The patient is insured through [facility].   Per test claim: PA required; PA submitted to above mentioned insurance via Latent Key/confirmation #/EOC [id number] Status is pending

## 2024-05-12 ENCOUNTER — Other Ambulatory Visit: Payer: Self-pay

## 2024-05-12 ENCOUNTER — Other Ambulatory Visit (HOSPITAL_BASED_OUTPATIENT_CLINIC_OR_DEPARTMENT_OTHER): Payer: Self-pay

## 2024-05-12 MED ORDER — WEGOVY 0.25 MG/0.5ML ~~LOC~~ SOAJ
0.2500 mg | SUBCUTANEOUS | 0 refills | Status: AC
  Filled 2024-05-12: qty 2, 28d supply, fill #0

## 2024-05-12 NOTE — Telephone Encounter (Signed)
 I sent wegovy  starting dose to [facility].
